# Patient Record
Sex: Female | Born: 1975
Health system: Southern US, Community
[De-identification: ages and names within clinical notes are randomized; demographics above are authoritative.]

## PROBLEM LIST (undated history)

## (undated) DIAGNOSIS — T781XXA Other adverse food reactions, not elsewhere classified, initial encounter: Secondary | ICD-10-CM

## (undated) DIAGNOSIS — E039 Hypothyroidism, unspecified: Secondary | ICD-10-CM

## (undated) HISTORY — DX: Hypothyroidism, unspecified: E03.9

## (undated) HISTORY — DX: Other adverse food reactions, not elsewhere classified, initial encounter: T78.1XXA

---

## 2018-03-06 DIAGNOSIS — Z23 Encounter for immunization: Secondary | ICD-10-CM | POA: Diagnosis not present

## 2019-05-27 DIAGNOSIS — Z20828 Contact with and (suspected) exposure to other viral communicable diseases: Secondary | ICD-10-CM | POA: Diagnosis not present

## 2019-05-27 DIAGNOSIS — Z20822 Contact with and (suspected) exposure to covid-19: Secondary | ICD-10-CM | POA: Diagnosis not present

## 2019-05-27 DIAGNOSIS — R519 Headache, unspecified: Secondary | ICD-10-CM | POA: Diagnosis not present

## 2019-05-27 DIAGNOSIS — R05 Cough: Secondary | ICD-10-CM | POA: Diagnosis not present

## 2019-05-27 DIAGNOSIS — Z7189 Other specified counseling: Secondary | ICD-10-CM | POA: Diagnosis not present

## 2019-11-04 ENCOUNTER — Other Ambulatory Visit: Payer: Self-pay | Admitting: Family Medicine

## 2019-11-04 ENCOUNTER — Other Ambulatory Visit (HOSPITAL_COMMUNITY)
Admission: RE | Admit: 2019-11-04 | Discharge: 2019-11-04 | Disposition: A | Discharge status: Home / Self Care | Payer: BC Managed Care – PPO | Source: Ambulatory Visit | Attending: Family Medicine | Admitting: Family Medicine

## 2019-11-04 DIAGNOSIS — Z124 Encounter for screening for malignant neoplasm of cervix: Secondary | ICD-10-CM | POA: Insufficient documentation

## 2019-11-04 DIAGNOSIS — Z0000 Encounter for general adult medical examination without abnormal findings: Secondary | ICD-10-CM | POA: Diagnosis not present

## 2019-11-05 ENCOUNTER — Other Ambulatory Visit: Payer: Self-pay | Admitting: Family Medicine

## 2019-11-05 DIAGNOSIS — Z1231 Encounter for screening mammogram for malignant neoplasm of breast: Secondary | ICD-10-CM

## 2019-11-06 LAB — CYTOLOGY - PAP
Comment: NEGATIVE
Diagnosis: NEGATIVE
High risk HPV: NEGATIVE

## 2019-11-13 DIAGNOSIS — Z13220 Encounter for screening for lipoid disorders: Secondary | ICD-10-CM | POA: Diagnosis not present

## 2019-11-13 DIAGNOSIS — Z0000 Encounter for general adult medical examination without abnormal findings: Secondary | ICD-10-CM | POA: Diagnosis not present

## 2019-11-19 ENCOUNTER — Ambulatory Visit
Admission: RE | Admit: 2019-11-19 | Discharge: 2019-11-19 | Disposition: A | Discharge status: Home / Self Care | Payer: BC Managed Care – PPO | Source: Ambulatory Visit | Attending: Family Medicine | Admitting: Family Medicine

## 2019-11-19 ENCOUNTER — Other Ambulatory Visit: Payer: Self-pay

## 2019-11-19 DIAGNOSIS — Z1231 Encounter for screening mammogram for malignant neoplasm of breast: Secondary | ICD-10-CM

## 2019-11-24 ENCOUNTER — Other Ambulatory Visit: Payer: Self-pay | Admitting: Family Medicine

## 2019-11-24 DIAGNOSIS — R928 Other abnormal and inconclusive findings on diagnostic imaging of breast: Secondary | ICD-10-CM

## 2019-11-26 DIAGNOSIS — D259 Leiomyoma of uterus, unspecified: Secondary | ICD-10-CM | POA: Diagnosis not present

## 2019-11-26 DIAGNOSIS — N8302 Follicular cyst of left ovary: Secondary | ICD-10-CM | POA: Diagnosis not present

## 2019-11-26 DIAGNOSIS — N839 Noninflammatory disorder of ovary, fallopian tube and broad ligament, unspecified: Secondary | ICD-10-CM | POA: Diagnosis not present

## 2019-11-30 NOTE — Progress Notes (Signed)
New Patient Note  RE: Sarah Leblanc MRN: [id number] DOB: 01/09/1976 Date of Office Visit: 12/01/2019  Referring provider: Ayesha RumpfYonjof, Mary, FNP Primary care provider: Ayesha RumpfYonjof, Mary, FNP  Chief Complaint: Food Intolerance (hazelnuts, raw almonds, walnuts, banana, apples, carrots, watermelon, broccoli, gum, raw leafy green veggies,carrageenan,), Allergic Rhinitis  (runny nose, watery eyes, congestion), and Allergic Reaction (pine trees cause hives and itching )  History of Present Illness: I had the pleasure of seeing Sarah Leblanc for initial evaluation at the [facility] on 12/01/2019. She is a 44 y.o. female, who is referred here by Ayesha RumpfYonjof, Mary, FNP for the evaluation of food sensitivities.  Foods:  She reports food allergy to multiple foods.   Broccoli (raw or cooked) causes stomach pain and vomiting usually within the same day. Last exposure was over 20 years ago.   Lettuces/leafy greens (raw) causes stomach pain for the past 20+ years. Tolerates cooked forms with no issues.  When there are high pollens counts these fresh foods cause perioral pruritus - bananas, apples, carrots, watermelon, cantaloupe, cherries. Tolerates processed forms and the apples she can tolerate during non-pollen seasons.   Gum - xantham gum, carrageenan cause stomach pains and joint pains.   Hazelnuts, raw almonds and walnuts cause perioral pruritus but tolerates if they are cooked.   Symptoms usually last for less than 1 day.  She was never evaluated in ED. She does have access to epinephrine autoinjector and not needed to use it.   Past work up includes: as a child but not sure of exact results.  Dietary History: patient has been eating other foods including milk, eggs, limited peanut, sesame, shellfish, seafood, soy, wheat, meats, fruits and vegetables.  Rhinitis: She reports symptoms of rhinorrhea, sneezing, nasal congestion, itchy eyes. Symptoms have been going on for 40+ years. The  symptoms are present mainly during the spring and fall. Anosmia: diminished. Headache: no. She has used zyrtec and Flonase prn with fair improvement in symptoms. Sinus infections: yes but not recently. Previous work up includes: as a child and had multiple positives per patient report. Patient was on allergy injections as a child for a few years with some benefit. No reactions to the injections.  Previous ENT evaluation: yes for burst eardrum. Previous sinus imaging: no. History of nasal polyps: no. Last eye exam: this year. History of reflux: takes famotidine 1-2 per day as needed.  Assessment and Plan: Sarah Leblanc is a 44 y.o. female with: Other allergic rhinitis Rhinoconjunctivitis symptoms for the past 40+ years mainly from the spring through fall.  Tried Zyrtec and Flonase with some benefit.  Used to be on allergy immunotherapy as a child with good benefit.  Today's skin testing showed: Positive to grass, weed, ragweed, trees, mold, dust mites, cat, cockroach.   Start environmental control measures as below.  May use over the counter antihistamines such as Zyrtec (cetirizine), Claritin (loratadine), Allegra (fexofenadine), or Xyzal (levocetirizine) daily as needed. May take it twice a day if needed.  May use Flonase (fluticasone) nasal spray 1 spray per nostril twice a day as needed for nasal congestion.   Declines eye drops.   Nasal saline spray (i.e., Simply Saline) or nasal saline lavage (i.e., NeilMed) is recommended as needed and prior to medicated nasal sprays.  Had a detailed discussion with patient/family that clinical history is suggestive of allergic rhinitis, and may benefit from allergy immunotherapy (AIT). Discussed in detail regarding the dosing, schedule, side effects (mild to moderate local allergic reaction and rarely  systemic allergic reactions including anaphylaxis), and benefits (significant improvement in nasal symptoms, seasonal flares of asthma) of immunotherapy with  the patient. There is significant time commitment involved with allergy shots, which includes weekly immunotherapy injections for first 9-12 months and then biweekly to monthly injections for 3-5 years. Consent signed.   Let us know when ready to start injections - patient will check with insurance first. Will need to send in Auvi-Q once decides to start.   Allergic conjunctivitis of both eyes  See assessment and plan as above for allergic rhinitis.  Adverse food reaction Patient avoiding a variety of foods including broccoli, lettuce, leafy greens, certain fresh fruits, gums, tree nuts and carrageenan. Symptoms include GI issues to perioral pruritus.   Today's skin testing showed: Borderline positive to walnuts, almond, hazelnut, pistachio, cabbage. Positive to peanuts.  Discussed that some of her food triggered oral and throat symptoms are likely caused by oral food allergy syndrome (OFAS). This is caused by cross reactivity of pollen with fresh fruits and vegetables, and nuts. Symptoms are usually localized in the form of itching and burning in mouth and throat. Very rarely it can progress to more severe symptoms. Eating foods in cooked or processed forms usually minimizes symptoms. I recommended avoidance of eating the problem foods, especially during the peak season(s). Sometimes, OFAS can induce severe throat swelling or even a systemic reaction; with such instance, I advised them to report to a local ER. A list of common pollens and food cross-reactivities was provided to the patient.   Continue to avoid foods that are bothersome.  For mild symptoms you can take over the counter antihistamines such as Benadryl and monitor symptoms closely. If symptoms worsen or if you have severe symptoms including breathing issues, throat closure, significant swelling, whole body hives, severe diarrhea and vomiting, lightheadedness then inject epinephrine and seek immediate medical care  afterwards.  Pollen-food allergy  See assessment and plan as above.  Insect sting Large localized reaction to fire ant bites.  Today's skin testing was negative to fire ant.   If having systemic symptoms in the future then will order fire ant IgE at that time.   Return in about 4 months (around 04/02/2020).  Other allergy screening: Asthma: as a child but no inhaler use for the past 30+ years Medication allergy:  ? Sulfa caused hives Hymenoptera allergy:   Fire ants cause large localized swelling  Urticaria: yes  Pines cause itchy bumps.  Eczema:no History of recurrent infections suggestive of immunodeficency: no  Diagnostics: Skin Testing: Environmental allergy panel and select foods. Positive to grass, weed, ragweed, trees, mold, dust mites, cat, cockroach.  Borderline positive to walnuts, almond, hazelnut, pistachio, cabbage. Positive to peanuts. Negative to fire ant.  Results discussed with patient/family.  Airborne Adult Perc - 12/01/19 1128    Time Antigen Placed 1125    Allergen Manufacturer Waynette Buttery    Location Back    Number of Test 59    Panel 1 Select    1. Control-Buffer 50% Glycerol Negative    2. Control-Histamine 1 mg/ml 2+    3. Albumin saline Negative    4. Bahia Negative    5. French [place] 4+    6. Johnson 4+    7. Kentucky Blue 4+    8. Meadow Fescue 4+    9. Perennial Rye 4+    10. Sweet Vernal 4+    11. Timothy --   +/-   12. Cocklebur 2+    13. Burweed Marshelder  4+    14. Ragweed, short 4+    15. Ragweed, Giant 2+    16. Plantain,  English 2+    17. Lamb's Quarters --   +/-   18. Sheep Sorrell 2+    19. Rough Pigweed 2+    20. Marsh Elder, Rough 3+    21. Mugwort, Common 3+    22. Ash mix 4+    23. Birch mix 4+    24. Beech American 4+    25. Box, Elder 4+    26. Cedar, red 4+    27. Cottonwood, Eastern 3+    28. Elm mix 3+    29. Hickory 4+    30. Maple mix Negative    31. Oak, Guinea[place] mix 4+    32. Pecan Pollen 4+    33. Pine  mix 4+    34. Sycamore Eastern 2+    35. Walnut, Black Pollen 4+    36. Alternaria alternata 2+    37. Cladosporium Herbarum Negative    38. Aspergillus mix 2+    39. Penicillium mix Negative    40. Bipolaris sorokiniana (Helminthosporium) 4+    41. Drechslera spicifera (Curvularia) 4+    42. Mucor plumbeus Negative    43. Fusarium moniliforme 4+    44. Aureobasidium pullulans (pullulara) Negative    45. Rhizopus oryzae Negative    46. Botrytis cinera 2+    47. Epicoccum nigrum 3+    48. Phoma betae Negative    49. Candida Albicans Negative    50. Trichophyton mentagrophytes Negative    51. Mite, D Farinae  5,000 AU/ml 3+    52. Mite, D Pteronyssinus  5,000 AU/ml 3+    53. Cat Hair 10,000 BAU/ml 2+    54.  Dog Epithelia Negative    55. Mixed Feathers Negative    56. Horse Epithelia Negative    57. Cockroach, German Negative    58. Mouse Negative    59. Tobacco Leaf Negative          Intradermal - 12/01/19 1202    Time Antigen Placed 1158    Allergen Manufacturer Waynette Buttery    Location Arm    Number of Test 3    Intradermal Select    Control Negative    Dog Negative    Cockroach 3+          Food Adult Perc - 12/01/19 1100    Time Antigen Placed 1125    Allergen Manufacturer Waynette Buttery    Location Back    Number of allergen test 18    1. Peanut --   9x5   10. Cashew Negative    11. Pecan Food Negative    12. Walnut Food --   3x3   13. Almond --   3x3   14. Hazelnut --   +\-   15. [place] nut Negative    16. Coconut Negative    17. Pistachio --   3x3   50. Cabbage Negative   +\-   51. Carrots Negative    57. Banana Negative    58. Apple Negative    61. Cantaloupe Negative    62. Watermelon Negative    65. Karaya Gum Negative    66. Acacia (Arabic Gum) Negative    6. Other Negative           Past Medical History: Patient Active Problem List   Diagnosis Date Noted  . Other allergic rhinitis 12/01/2019  . Pollen-food allergy 12/01/2019  .  Adverse food  reaction 12/01/2019  . Insect sting 12/01/2019  . Allergic conjunctivitis of both eyes 12/01/2019   Past Medical History:  Diagnosis Date  . Food sensitivity with gastrointestinal symptoms   . Hypothyroidism   . Oral allergy syndrome    Past Surgical History: History reviewed. No pertinent surgical history. Medication List:  Current Outpatient Medications  Medication Sig Dispense Refill  . atorvastatin (LIPITOR) 20 MG tablet Take 20 mg by mouth daily.    . calcium-vitamin D (OSCAL WITH D) 500-200 MG-UNIT tablet Take 1 tablet by mouth.    . cetirizine (ZYRTEC) 10 MG tablet Take 10 mg by mouth daily.    . famotidine (PEPCID) 10 MG tablet Take 10 mg by mouth 2 (two) times daily.    Marland Kitchen levothyroxine (SYNTHROID) 50 MCG tablet Take 50 mcg by mouth daily before breakfast.    . Multiple Vitamin (MULTIVITAMIN) capsule Take 1 capsule by mouth daily.     No current facility-administered medications for this visit.   Allergies: Allergies  Allergen Reactions  . Other Hives   Social History: Social History   Socioeconomic History  . Marital status: Married    Spouse name: Not on file  . Number of children: Not on file  . Years of education: Not on file  . Highest education level: Not on file  Occupational History  . Not on file  Tobacco Use  . Smoking status: Never Smoker  . Smokeless tobacco: Never Used  Vaping Use  . Vaping Use: Never used  Substance and Sexual Activity  . Alcohol use: Never  . Drug use: Never  . Sexual activity: Never  Other Topics Concern  . Not on file  Social History Narrative  . Not on file   Social Determinants of Health   Financial Resource Strain:   . Difficulty of Paying Living Expenses:   Food Insecurity:   . Worried About Programme researcher, broadcasting/film/video in the Last Year:   . Barista in the Last Year:   Transportation Needs:   . Freight forwarder (Medical):   Marland Kitchen Lack of Transportation (Non-Medical):   Physical Activity:   . Days of  Exercise per Week:   . Minutes of Exercise per Session:   Stress:   . Feeling of Stress :   Social Connections:   . Frequency of Communication with Friends and Family:   . Frequency of Social Gatherings with Friends and Family:   . Attends Religious Services:   . Active Member of Clubs or Organizations:   . Attends Banker Meetings:   Marland Kitchen Marital Status:    Lives in a 44 year old house. Smoking: denies Occupation: Engineer, civil (consulting) HistorySurveyor, minerals in the house: no Engineer, civil (consulting) in the family room: yes Carpet in the bedroom: yes Heating: gas Cooling: central Pet: no  Family History: Family History  Problem Relation Age of Onset  . Hypothyroidism Mother   . Arthritis/Rheumatoid Father    Problem                               Relation Asthma                                   No  Eczema  No  Food allergy                          No  Allergic rhino conjunctivitis     No   Review of Systems  Constitutional: Negative for appetite change, chills, fever and unexpected weight change.  HENT: Positive for congestion and rhinorrhea.   Eyes: Positive for itching.  Respiratory: Negative for cough, chest tightness, shortness of breath and wheezing.   Cardiovascular: Negative for chest pain.  Gastrointestinal: Negative for abdominal pain.  Genitourinary: Negative for difficulty urinating.  Skin: Negative for rash.  Allergic/Immunologic: Positive for environmental allergies and food allergies.  Neurological: Negative for headaches.   Objective: BP 116/78 (BP Location: Left Arm, Patient Position: Sitting, Cuff Size: Normal)   Pulse 81   Temp 97.6 F (36.4 C) (Temporal)   Resp 16   Ht 5\' 2"  (1.575 m)   Wt 126 lb 12.8 oz (57.5 kg)   SpO2 97%   BMI 23.19 kg/m  Body mass index is 23.19 kg/m. Physical Exam Vitals and nursing note reviewed.  Constitutional:      Appearance: Normal appearance. She is well-developed.   HENT:     Head: Normocephalic and atraumatic.     Right Ear: Tympanic membrane and external ear normal.     Left Ear: Tympanic membrane and external ear normal.     Nose: Nose normal. No rhinorrhea.     Mouth/Throat:     Mouth: Mucous membranes are moist.     Pharynx: Oropharynx is clear.  Eyes:     Conjunctiva/sclera: Conjunctivae normal.  Cardiovascular:     Rate and Rhythm: Normal rate and regular rhythm.     Heart sounds: Normal heart sounds. No murmur heard.  No friction rub. No gallop.   Pulmonary:     Effort: Pulmonary effort is normal.     Breath sounds: Normal breath sounds. No wheezing, rhonchi or rales.  Musculoskeletal:     Cervical back: Neck supple.  Skin:    General: Skin is warm.     Findings: No rash.  Neurological:     Mental Status: She is alert and oriented to person, place, and time.  Psychiatric:        Behavior: Behavior normal.    The plan was reviewed with the patient/family, and all questions/concerned were addressed.  It was my pleasure to see Sarah Leblanc today and participate in her care. Please feel free to contact me with any questions or concerns.  Sincerely,  Sarah Sago, DO Allergy & Immunology  [facility] office: [phone] [facility] office: [phone] [place] office: [phone]

## 2019-12-01 ENCOUNTER — Encounter: Payer: Self-pay | Admitting: Allergy

## 2019-12-01 ENCOUNTER — Ambulatory Visit (INDEPENDENT_AMBULATORY_CARE_PROVIDER_SITE_OTHER): Payer: BC Managed Care – PPO | Admitting: Allergy

## 2019-12-01 ENCOUNTER — Other Ambulatory Visit: Payer: Self-pay

## 2019-12-01 VITALS — BP 116/78 | HR 81 | Temp 97.6°F | Resp 16 | Ht 62.0 in | Wt 126.8 lb

## 2019-12-01 DIAGNOSIS — T63481D Toxic effect of venom of other arthropod, accidental (unintentional), subsequent encounter: Secondary | ICD-10-CM

## 2019-12-01 DIAGNOSIS — T781XXD Other adverse food reactions, not elsewhere classified, subsequent encounter: Secondary | ICD-10-CM | POA: Insufficient documentation

## 2019-12-01 DIAGNOSIS — J3089 Other allergic rhinitis: Secondary | ICD-10-CM | POA: Diagnosis not present

## 2019-12-01 DIAGNOSIS — H1013 Acute atopic conjunctivitis, bilateral: Secondary | ICD-10-CM | POA: Insufficient documentation

## 2019-12-01 DIAGNOSIS — T781XXA Other adverse food reactions, not elsewhere classified, initial encounter: Secondary | ICD-10-CM | POA: Insufficient documentation

## 2019-12-01 DIAGNOSIS — T63481A Toxic effect of venom of other arthropod, accidental (unintentional), initial encounter: Secondary | ICD-10-CM | POA: Insufficient documentation

## 2019-12-01 NOTE — Assessment & Plan Note (Signed)
Rhinoconjunctivitis symptoms for the past 40+ years mainly from the spring through fall.  Tried Zyrtec and Flonase with some benefit.  Used to be on allergy immunotherapy as a child with good benefit.  Today's skin testing showed: Positive to grass, weed, ragweed, trees, mold, dust mites, cat, cockroach.   Start environmental control measures as below.  May use over the counter antihistamines such as Zyrtec (cetirizine), Claritin (loratadine), Allegra (fexofenadine), or Xyzal (levocetirizine) daily as needed. May take it twice a day if needed.  May use Flonase (fluticasone) nasal spray 1 spray per nostril twice a day as needed for nasal congestion.   Declines eye drops.   Nasal saline spray (i.e., Simply Saline) or nasal saline lavage (i.e., NeilMed) is recommended as needed and prior to medicated nasal sprays.  Had a detailed discussion with patient/family that clinical history is suggestive of allergic rhinitis, and may benefit from allergy immunotherapy (AIT). Discussed in detail regarding the dosing, schedule, side effects (mild to moderate local allergic reaction and rarely systemic allergic reactions including anaphylaxis), and benefits (significant improvement in nasal symptoms, seasonal flares of asthma) of immunotherapy with the patient. There is significant time commitment involved with allergy shots, which includes weekly immunotherapy injections for first 9-12 months and then biweekly to monthly injections for 3-5 years. Consent signed.   Let us know when ready to start injections - patient will check with insurance first. Will need to send in Auvi-Q once decides to start.

## 2019-12-01 NOTE — Assessment & Plan Note (Signed)
.   See assessment and plan as above. 

## 2019-12-01 NOTE — Patient Instructions (Addendum)
Today's skin testing showed: Positive to grass, weed, ragweed, trees, mold, dust mites, cat, cockroach.  Borderline positive to walnuts, almond, hazelnut, pistachio, cabbage. Positive to peanuts. Negative to fire ant.  Food:   Discussed that her food triggered oral and throat symptoms are likely caused by oral food allergy syndrome (OFAS). This is caused by cross reactivity of pollen with fresh fruits and vegetables, and nuts. Symptoms are usually localized in the form of itching and burning in mouth and throat. Very rarely it can progress to more severe symptoms. Eating foods in cooked or processed forms usually minimizes symptoms. I recommended avoidance of eating the problem foods, especially during the peak season(s). Sometimes, OFAS can induce severe throat swelling or even a systemic reaction; with such instance, I advised them to report to a local ER. A list of common pollens and food cross-reactivities was provided to the patient.   Continue to avoid foods that are bothersome.  Environmental allergies  Start environmental control measures as below.  May use over the counter antihistamines such as Zyrtec (cetirizine), Claritin (loratadine), Allegra (fexofenadine), or Xyzal (levocetirizine) daily as needed. May take it twice a day if needed.  May use Flonase (fluticasone) nasal spray 1 spray per nostril twice a day as needed for nasal congestion.   Nasal saline spray (i.e., Simply Saline) or nasal saline lavage (i.e., NeilMed) is recommended as needed and prior to medicated nasal sprays.  Had a detailed discussion with patient/family that clinical history is suggestive of allergic rhinitis, and may benefit from allergy immunotherapy (AIT). Discussed in detail regarding the dosing, schedule, side effects (mild to moderate local allergic reaction and rarely systemic allergic reactions including anaphylaxis), and benefits (significant improvement in nasal symptoms, seasonal flares of asthma)  of immunotherapy with the patient. There is significant time commitment involved with allergy shots, which includes weekly immunotherapy injections for first 9-12 months and then biweekly to monthly injections for 3-5 years.   Let us know when ready to start injections.  Insect stings:  If you notice any worsening symptoms. Let us know and we can order the bloodwork.  Follow up in 4 months or sooner if needed.  3 weeks for first allergy injection.   Reducing Pollen Exposure . Pollen seasons: trees (spring), grass (summer) and ragweed/weeds (fall). Marland Kitchen Keep windows closed in your home and car to lower pollen exposure.  Lilian Kapur air conditioning in the bedroom and throughout the house if possible.  . Avoid going out in dry windy days - especially early morning. . Pollen counts are highest between 5 - 10 AM and on dry, hot and windy days.  . Save outside activities for late afternoon or after a heavy rain, when pollen levels are lower.  . Avoid mowing of grass if you have grass pollen allergy. Marland Kitchen Be aware that pollen can also be transported indoors on people and pets.  . Dry your clothes in an automatic dryer rather than hanging them outside where they might collect pollen.  . Rinse hair and eyes before bedtime. Mold Control . Mold and fungi can grow on a variety of surfaces provided certain temperature and moisture conditions exist.  . Outdoor molds grow on plants, decaying vegetation and soil. The major outdoor mold, Alternaria and Cladosporium, are found in very high numbers during hot and dry conditions. Generally, a late summer - fall peak is seen for common outdoor fungal spores. Rain will temporarily lower outdoor mold spore count, but counts rise rapidly when the rainy period ends. Marland Kitchen  The most important indoor molds are Aspergillus and Penicillium. Dark, humid and poorly ventilated basements are ideal sites for mold growth. The next most common sites of mold growth are the bathroom and the  kitchen. Outdoor (Seasonal) Mold Control . Use air conditioning and keep windows closed. . Avoid exposure to decaying vegetation. Marland Kitchen Avoid leaf raking. . Avoid grain handling. . Consider wearing a face mask if working in moldy areas.  Indoor (Perennial) Mold Control  . Maintain humidity below 50%. . Get rid of mold growth on hard surfaces with water, detergent and, if necessary, 5% bleach (do not mix with other cleaners). Then dry the area completely. If mold covers an area more than 10 square feet, consider hiring an indoor environmental professional. . For clothing, washing with soap and water is best. If moldy items cannot be cleaned and dried, throw them away. . Remove sources e.g. contaminated carpets. . Repair and seal leaking roofs or pipes. Using dehumidifiers in damp basements may be helpful, but empty the water and clean units regularly to prevent mildew from forming. All rooms, especially basements, bathrooms and kitchens, require ventilation and cleaning to deter mold and mildew growth. Avoid carpeting on concrete or damp floors, and storing items in damp areas. Control of House Dust Mite Allergen . Dust mite allergens are a common trigger of allergy and asthma symptoms. While they can be found throughout the house, these microscopic creatures thrive in warm, humid environments such as bedding, upholstered furniture and carpeting. . Because so much time is spent in the bedroom, it is essential to reduce mite levels there.  . Encase pillows, mattresses, and box springs in special allergen-proof fabric covers or airtight, zippered plastic covers.  . Bedding should be washed weekly in hot water (130 F) and dried in a hot dryer. Allergen-proof covers are available for comforters and pillows that can't be regularly washed.  Reyes Ivan the allergy-proof covers every few months. Minimize clutter in the bedroom. Keep pets out of the bedroom.  Marland Kitchen Keep humidity less than 50% by using a dehumidifier  or air conditioning. You can buy a humidity measuring device called a hygrometer to monitor this.  . If possible, replace carpets with hardwood, linoleum, or washable area rugs. If that's not possible, vacuum frequently with a vacuum that has a HEPA filter. . Remove all upholstered furniture and non-washable window drapes from the bedroom. . Remove all non-washable stuffed toys from the bedroom.  Wash stuffed toys weekly. Pet Allergen Avoidance: . Contrary to popular opinion, there are no "hypoallergenic" breeds of dogs or cats. That is because people are not allergic to an animal's hair, but to an allergen found in the animal's saliva, dander (dead skin flakes) or urine. Pet allergy symptoms typically occur within minutes. For some people, symptoms can build up and become most severe 8 to 12 hours after contact with the animal. People with severe allergies can experience reactions in public places if dander has been transported on the pet owners' clothing. Marland Kitchen Keeping an animal outdoors is only a partial solution, since homes with pets in the yard still have higher concentrations of animal allergens. . Before getting a pet, ask your allergist to determine if you are allergic to animals. If your pet is already considered part of your family, try to minimize contact and keep the pet out of the bedroom and other rooms where you spend a great deal of time. . As with dust mites, vacuum carpets often or replace carpet with a  hardwood floor, tile or linoleum. . High-efficiency particulate air (HEPA) cleaners can reduce allergen levels over time. . While dander and saliva are the source of cat and dog allergens, urine is the source of allergens from rabbits, hamsters, mice and [place] pigs; so ask a non-allergic family member to clean the animal's cage. . If you have a pet allergy, talk to your allergist about the potential for allergy immunotherapy (allergy shots). This strategy can often provide long-term  relief.

## 2019-12-01 NOTE — Assessment & Plan Note (Signed)
Patient avoiding a variety of foods including broccoli, lettuce, leafy greens, certain fresh fruits, gums, tree nuts and carrageenan. Symptoms include GI issues to perioral pruritus.   Today's skin testing showed: Borderline positive to walnuts, almond, hazelnut, pistachio, cabbage. Positive to peanuts.  Discussed that some of her food triggered oral and throat symptoms are likely caused by oral food allergy syndrome (OFAS). This is caused by cross reactivity of pollen with fresh fruits and vegetables, and nuts. Symptoms are usually localized in the form of itching and burning in mouth and throat. Very rarely it can progress to more severe symptoms. Eating foods in cooked or processed forms usually minimizes symptoms. I recommended avoidance of eating the problem foods, especially during the peak season(s). Sometimes, OFAS can induce severe throat swelling or even a systemic reaction; with such instance, I advised them to report to a local ER. A list of common pollens and food cross-reactivities was provided to the patient.   Continue to avoid foods that are bothersome.  For mild symptoms you can take over the counter antihistamines such as Benadryl and monitor symptoms closely. If symptoms worsen or if you have severe symptoms including breathing issues, throat closure, significant swelling, whole body hives, severe diarrhea and vomiting, lightheadedness then inject epinephrine and seek immediate medical care afterwards.

## 2019-12-01 NOTE — Assessment & Plan Note (Signed)
   See assessment and plan as above for allergic rhinitis.  

## 2019-12-01 NOTE — Assessment & Plan Note (Signed)
Large localized reaction to fire ant bites.  Today's skin testing was negative to fire ant.   If having systemic symptoms in the future then will order fire ant IgE at that time.

## 2019-12-02 ENCOUNTER — Ambulatory Visit
Admission: RE | Admit: 2019-12-02 | Discharge: 2019-12-02 | Disposition: A | Discharge status: Home / Self Care | Payer: BC Managed Care – PPO | Source: Ambulatory Visit | Attending: Family Medicine | Admitting: Family Medicine

## 2019-12-02 ENCOUNTER — Telehealth: Payer: Self-pay | Admitting: Allergy

## 2019-12-02 DIAGNOSIS — R928 Other abnormal and inconclusive findings on diagnostic imaging of breast: Secondary | ICD-10-CM | POA: Diagnosis not present

## 2019-12-02 DIAGNOSIS — N6002 Solitary cyst of left breast: Secondary | ICD-10-CM | POA: Diagnosis not present

## 2019-12-02 MED ORDER — EPINEPHRINE 0.3 MG/0.3ML IJ SOAJ
0.3000 mg | Freq: Once | INTRAMUSCULAR | 2 refills | Status: AC
Start: 2019-12-02 — End: 2019-12-02

## 2019-12-02 NOTE — Telephone Encounter (Signed)
Patient seen Dr. Selena Batten yesterday and Auvi-Q was to be sent in if patient decided to move forward with allergy shots. I have sent the prescription to [facility] and I have left a detailed message informing patient.

## 2019-12-02 NOTE — Telephone Encounter (Signed)
Patient called to set up starting allergy injections. Patient would like Epi Pen sent into pharmacy.  Please advise.

## 2019-12-10 NOTE — Addendum Note (Signed)
Addended by: Ellamae Sia on: 12/10/2019 05:45 PM   Modules accepted: Orders

## 2019-12-17 DIAGNOSIS — J301 Allergic rhinitis due to pollen: Secondary | ICD-10-CM | POA: Diagnosis not present

## 2019-12-18 DIAGNOSIS — J3089 Other allergic rhinitis: Secondary | ICD-10-CM | POA: Diagnosis not present

## 2020-01-14 ENCOUNTER — Other Ambulatory Visit: Payer: Self-pay

## 2020-01-14 ENCOUNTER — Ambulatory Visit (INDEPENDENT_AMBULATORY_CARE_PROVIDER_SITE_OTHER): Payer: BC Managed Care – PPO

## 2020-01-14 DIAGNOSIS — J309 Allergic rhinitis, unspecified: Secondary | ICD-10-CM | POA: Diagnosis not present

## 2020-01-14 NOTE — Progress Notes (Signed)
Immunotherapy   Patient Details  Name: Sarah Leblanc MRN: [id number] Date of Birth: May 13, 1976  01/14/2020  Tereasa Coop here to start allergy injections. Patient received 0.05 of bother her blue vials with an expiration of 12/17/2020. One with G-W-RW-T and the other with M-DM-C-CR. Patient  Following schedule:  B Frequency: Weekly  Epi-Pen: Yes Consent signed and patient instructions given.   Dub Mikes 01/14/2020, 10:15 AM

## 2020-01-20 DIAGNOSIS — E039 Hypothyroidism, unspecified: Secondary | ICD-10-CM | POA: Diagnosis not present

## 2020-01-21 ENCOUNTER — Ambulatory Visit (INDEPENDENT_AMBULATORY_CARE_PROVIDER_SITE_OTHER): Payer: BC Managed Care – PPO

## 2020-01-21 ENCOUNTER — Other Ambulatory Visit: Payer: Self-pay

## 2020-01-21 DIAGNOSIS — J309 Allergic rhinitis, unspecified: Secondary | ICD-10-CM | POA: Diagnosis not present

## 2020-01-26 ENCOUNTER — Other Ambulatory Visit: Payer: Self-pay

## 2020-01-26 ENCOUNTER — Ambulatory Visit (INDEPENDENT_AMBULATORY_CARE_PROVIDER_SITE_OTHER): Payer: BC Managed Care – PPO

## 2020-01-26 DIAGNOSIS — J309 Allergic rhinitis, unspecified: Secondary | ICD-10-CM | POA: Diagnosis not present

## 2020-02-02 ENCOUNTER — Other Ambulatory Visit: Payer: Self-pay

## 2020-02-02 ENCOUNTER — Ambulatory Visit (INDEPENDENT_AMBULATORY_CARE_PROVIDER_SITE_OTHER): Payer: BC Managed Care – PPO

## 2020-02-02 DIAGNOSIS — J309 Allergic rhinitis, unspecified: Secondary | ICD-10-CM

## 2020-02-09 ENCOUNTER — Other Ambulatory Visit: Payer: Self-pay

## 2020-02-09 ENCOUNTER — Ambulatory Visit (INDEPENDENT_AMBULATORY_CARE_PROVIDER_SITE_OTHER): Payer: BC Managed Care – PPO

## 2020-02-09 DIAGNOSIS — J309 Allergic rhinitis, unspecified: Secondary | ICD-10-CM | POA: Diagnosis not present

## 2020-02-16 ENCOUNTER — Other Ambulatory Visit: Payer: Self-pay

## 2020-02-16 ENCOUNTER — Ambulatory Visit (INDEPENDENT_AMBULATORY_CARE_PROVIDER_SITE_OTHER): Payer: BC Managed Care – PPO

## 2020-02-16 DIAGNOSIS — J309 Allergic rhinitis, unspecified: Secondary | ICD-10-CM

## 2020-02-25 ENCOUNTER — Ambulatory Visit (INDEPENDENT_AMBULATORY_CARE_PROVIDER_SITE_OTHER): Payer: BC Managed Care – PPO

## 2020-02-25 ENCOUNTER — Other Ambulatory Visit: Payer: Self-pay

## 2020-02-25 DIAGNOSIS — J309 Allergic rhinitis, unspecified: Secondary | ICD-10-CM

## 2020-03-01 ENCOUNTER — Other Ambulatory Visit: Payer: Self-pay

## 2020-03-01 ENCOUNTER — Ambulatory Visit (INDEPENDENT_AMBULATORY_CARE_PROVIDER_SITE_OTHER): Payer: BC Managed Care – PPO

## 2020-03-01 DIAGNOSIS — J309 Allergic rhinitis, unspecified: Secondary | ICD-10-CM

## 2020-03-08 ENCOUNTER — Ambulatory Visit (INDEPENDENT_AMBULATORY_CARE_PROVIDER_SITE_OTHER): Payer: BC Managed Care – PPO

## 2020-03-08 ENCOUNTER — Other Ambulatory Visit: Payer: Self-pay

## 2020-03-08 DIAGNOSIS — J309 Allergic rhinitis, unspecified: Secondary | ICD-10-CM

## 2020-03-15 ENCOUNTER — Other Ambulatory Visit: Payer: Self-pay

## 2020-03-15 ENCOUNTER — Ambulatory Visit (INDEPENDENT_AMBULATORY_CARE_PROVIDER_SITE_OTHER): Payer: BC Managed Care – PPO

## 2020-03-15 DIAGNOSIS — J309 Allergic rhinitis, unspecified: Secondary | ICD-10-CM | POA: Diagnosis not present

## 2020-03-22 ENCOUNTER — Ambulatory Visit (INDEPENDENT_AMBULATORY_CARE_PROVIDER_SITE_OTHER): Payer: BC Managed Care – PPO

## 2020-03-22 ENCOUNTER — Other Ambulatory Visit: Payer: Self-pay

## 2020-03-22 DIAGNOSIS — J309 Allergic rhinitis, unspecified: Secondary | ICD-10-CM

## 2020-03-28 DIAGNOSIS — E039 Hypothyroidism, unspecified: Secondary | ICD-10-CM | POA: Diagnosis not present

## 2020-03-29 ENCOUNTER — Ambulatory Visit (INDEPENDENT_AMBULATORY_CARE_PROVIDER_SITE_OTHER): Payer: BC Managed Care – PPO

## 2020-03-29 ENCOUNTER — Other Ambulatory Visit: Payer: Self-pay

## 2020-03-29 DIAGNOSIS — J309 Allergic rhinitis, unspecified: Secondary | ICD-10-CM

## 2020-04-05 ENCOUNTER — Ambulatory Visit (INDEPENDENT_AMBULATORY_CARE_PROVIDER_SITE_OTHER): Payer: BC Managed Care – PPO | Admitting: Allergy

## 2020-04-05 ENCOUNTER — Other Ambulatory Visit: Payer: Self-pay

## 2020-04-05 ENCOUNTER — Encounter: Payer: Self-pay | Admitting: Allergy

## 2020-04-05 VITALS — BP 110/70 | HR 86 | Resp 18

## 2020-04-05 DIAGNOSIS — T63481D Toxic effect of venom of other arthropod, accidental (unintentional), subsequent encounter: Secondary | ICD-10-CM | POA: Diagnosis not present

## 2020-04-05 DIAGNOSIS — J302 Other seasonal allergic rhinitis: Secondary | ICD-10-CM

## 2020-04-05 DIAGNOSIS — T781XXD Other adverse food reactions, not elsewhere classified, subsequent encounter: Secondary | ICD-10-CM | POA: Diagnosis not present

## 2020-04-05 DIAGNOSIS — J3089 Other allergic rhinitis: Secondary | ICD-10-CM

## 2020-04-05 DIAGNOSIS — H1010 Acute atopic conjunctivitis, unspecified eye: Secondary | ICD-10-CM | POA: Insufficient documentation

## 2020-04-05 DIAGNOSIS — J309 Allergic rhinitis, unspecified: Secondary | ICD-10-CM | POA: Diagnosis not present

## 2020-04-05 MED ORDER — MONTELUKAST SODIUM 10 MG PO TABS: 10.0000 mg | ORAL_TABLET | Freq: Every day | ORAL | 5 refills | Status: DC

## 2020-04-05 NOTE — Assessment & Plan Note (Addendum)
Past history - Rhinoconjunctivitis symptoms for the past 40+ years mainly from the spring through fall. Used to be on allergy immunotherapy as a child with good benefit. 2021 skin testing showed: Positive to grass, weed, ragweed, trees, mold, dust mites, cat, cockroach.  Interim history - started AIT on 01/14/2020 (G-W-RW-T & M-DM-C-CR). Doing well with some localized reactions.   Continue allergy injections - given today.  Take Singulair 10mg  the night before injection. Cautioned that in some children/adults can experience behavioral changes including hyperactivity, agitation, depression, sleep disturbances and suicidal ideations. These side effects are rare, but if you notice them you should notify me and discontinue Singulair (montelukast).  May also take Xyzal the night before injection.  Continue environmental control measures as below.  May use over the counter antihistamines such as Zyrtec (cetirizine), Claritin (loratadine), Allegra (fexofenadine), or Xyzal (levocetirizine) daily as needed. May take it twice a day if needed.  May use Flonase (fluticasone) nasal spray 1 spray per nostril twice a day as needed for nasal congestion.   Nasal saline spray (i.e., Simply Saline) or nasal saline lavage (i.e., NeilMed) is recommended as needed and prior to medicated nasal sprays.

## 2020-04-05 NOTE — Progress Notes (Signed)
Follow Up Note  RE: Sarah Leblanc MRN: [id number] DOB: 02-19-1976 Date of Office Visit: 04/05/2020  Referring provider: Ayesha Rumpf, FNP Primary care provider: Ayesha Rumpf, FNP  Chief Complaint: follow up  History of Present Illness: I had the pleasure of seeing Sarah Leblanc for a follow up visit at the [facility] on 04/05/2020. She is a 44 y.o. female, who is being followed for allergic rhinoconjunctivitis on AIT, adverse food reaction, oral allergy syndrome. Her previous allergy office visit was on 12/01/2019 with Dr. Selena Batten. Today is a regular follow up visit.  Allergic rhino conjunctivitis Started allergy injections in September and having some localized reactions 2-3 hours after the injection. Currently Claritin twice a day the night before the injection.  Not using any eye drops or nasal sprays daily.  Adverse food reaction Currently avoiding foods that are bothering her.  Insect sting No stings since the last visit.   Assessment and Plan: Ashanti is a 44 y.o. female with: Seasonal and perennial allergic rhinoconjunctivitis Past history - Rhinoconjunctivitis symptoms for the past 40+ years mainly from the spring through fall. Used to be on allergy immunotherapy as a child with good benefit. 2021 skin testing showed: Positive to grass, weed, ragweed, trees, mold, dust mites, cat, cockroach.  Interim history - started AIT on 01/14/2020 (G-W-RW-T & M-DM-C-CR). Doing well with some localized reactions.   Continue allergy injections - given today.  Take Singulair 10mg  the night before injection. Cautioned that in some children/adults can experience behavioral changes including hyperactivity, agitation, depression, sleep disturbances and suicidal ideations. These side effects are rare, but if you notice them you should notify me and discontinue Singulair (montelukast).  May also take Xyzal the night before injection.  Continue environmental control measures as  below.  May use over the counter antihistamines such as Zyrtec (cetirizine), Claritin (loratadine), Allegra (fexofenadine), or Xyzal (levocetirizine) daily as needed. May take it twice a day if needed.  May use Flonase (fluticasone) nasal spray 1 spray per nostril twice a day as needed for nasal congestion.   Nasal saline spray (i.e., Simply Saline) or nasal saline lavage (i.e., NeilMed) is recommended as needed and prior to medicated nasal sprays.  Pollen-food allergy Past history - Patient avoiding a variety of foods including broccoli, lettuce, leafy greens, certain fresh fruits, gums, tree nuts and carrageenan. Symptoms include GI issues to perioral pruritus. 2021 skin testing showed: Borderline positive to walnuts, almond, hazelnut, pistachio, cabbage. Positive to peanuts. Interim history - no reactions.  Continue to avoid foods that are bothersome.  For mild symptoms you can take over the counter antihistamines such as Benadryl and monitor symptoms closely. If symptoms worsen or if you have severe symptoms including breathing issues, throat closure, significant swelling, whole body hives, severe diarrhea and vomiting, lightheadedness then inject epinephrine and seek immediate medical care afterwards.  Insect sting Past history - Large localized reaction to fire ant bites. 2021 skin testing was negative to fire ant.   If having systemic symptoms in the future then will order fire ant IgE at that time.   Return in about 6 months (around 10/03/2020).  Meds ordered this encounter  Medications  . montelukast (SINGULAIR) 10 MG tablet    Sig: Take 1 tablet (10 mg total) by mouth at bedtime.    Dispense:  30 tablet    Refill:  5   Lab Orders  No laboratory test(s) ordered today    Diagnostics: None.   Medication List:  Current Outpatient  Medications  Medication Sig Dispense Refill  . atorvastatin (LIPITOR) 20 MG tablet Take 20 mg by mouth daily.     Marland Kitchen AUVI-Q 0.3 MG/0.3ML SOAJ  injection Inject into the muscle as directed.     . calcium-vitamin D (OSCAL WITH D) 500-200 MG-UNIT tablet Take 1 tablet by mouth.     . cetirizine (ZYRTEC) 10 MG tablet Take 10 mg by mouth daily.     . famotidine (PEPCID) 10 MG tablet Take 10 mg by mouth 2 (two) times daily.     Marland Kitchen levothyroxine (SYNTHROID) 50 MCG tablet Take 50 mcg by mouth daily before breakfast.     . levothyroxine (SYNTHROID) 75 MCG tablet Take 75 mcg by mouth daily.     . Multiple Vitamin (MULTIVITAMIN) capsule Take 1 capsule by mouth daily.     . montelukast (SINGULAIR) 10 MG tablet Take 1 tablet (10 mg total) by mouth at bedtime. 30 tablet 5   No current facility-administered medications for this visit.   Allergies: Allergies  Allergen Reactions  . Other Hives   I reviewed her past medical history, social history, family history, and environmental history and no significant changes have been reported from her previous visit.  Review of Systems  Constitutional: Negative for appetite change, chills, fever and unexpected weight change.  HENT: Positive for congestion and rhinorrhea.   Eyes: Positive for itching.  Respiratory: Negative for cough, chest tightness, shortness of breath and wheezing.   Cardiovascular: Negative for chest pain.  Gastrointestinal: Negative for abdominal pain.  Genitourinary: Negative for difficulty urinating.  Skin: Negative for rash.  Allergic/Immunologic: Positive for environmental allergies and food allergies.  Neurological: Negative for headaches.   Objective: BP 110/70   Pulse 86   Resp 18   SpO2 98%  There is no height or weight on file to calculate BMI. Physical Exam Vitals and nursing note reviewed.  Constitutional:      Appearance: Normal appearance. She is well-developed.  HENT:     Head: Normocephalic and atraumatic.     Right Ear: Tympanic membrane and external ear normal.     Left Ear: Tympanic membrane and external ear normal.     Nose: Nose normal. No  rhinorrhea.     Mouth/Throat:     Mouth: Mucous membranes are moist.     Pharynx: Oropharynx is clear.  Eyes:     Conjunctiva/sclera: Conjunctivae normal.  Cardiovascular:     Rate and Rhythm: Normal rate and regular rhythm.     Heart sounds: Normal heart sounds. No murmur heard.  No friction rub. No gallop.   Pulmonary:     Effort: Pulmonary effort is normal.     Breath sounds: Normal breath sounds. No wheezing, rhonchi or rales.  Musculoskeletal:     Cervical back: Neck supple.  Skin:    General: Skin is warm.     Findings: No rash.  Neurological:     Mental Status: She is alert and oriented to person, place, and time.  Psychiatric:        Behavior: Behavior normal.    Previous notes and tests were reviewed. The plan was reviewed with the patient/family, and all questions/concerned were addressed.  It was my pleasure to see Sarah Leblanc today and participate in her care. Please feel free to contact me with any questions or concerns.  Sincerely,  Wyline Mood, DO Allergy & Immunology  [facility] office: [phone] [facility] office: [phone]

## 2020-04-05 NOTE — Patient Instructions (Addendum)
Environmental allergies Past skin testing showed:   Positive to grass, weed, ragweed, trees, mold, dust mites, cat, cockroach.   Continue allergy injections.  Take Singulair 10mg  the night before injection. Cautioned that in some children/adults can experience behavioral changes including hyperactivity, agitation, depression, sleep disturbances and suicidal ideations. These side effects are rare, but if you notice them you should notify me and discontinue Singulair (montelukast).  May also take Xyzal the night before injection.   Continue environmental control measures as below.  May use over the counter antihistamines such as Zyrtec (cetirizine), Claritin (loratadine), Allegra (fexofenadine), or Xyzal (levocetirizine) daily as needed. May take it twice a day if needed.  May use Flonase (fluticasone) nasal spray 1 spray per nostril twice a day as needed for nasal congestion.   Nasal saline spray (i.e., Simply Saline) or nasal saline lavage (i.e., NeilMed) is recommended as needed and prior to medicated nasal sprays.  Food:   Borderline positive to walnuts, almond, hazelnut, pistachio, cabbage.  Positive to peanuts.  Continue to avoid foods that are bothersome.  For mild symptoms you can take over the counter antihistamines such as Benadryl and monitor symptoms closely. If symptoms worsen or if you have severe symptoms including breathing issues, throat closure, significant swelling, whole body hives, severe diarrhea and vomiting, lightheadedness then inject epinephrine and seek immediate medical care afterwards.  Insect stings:  Negative skin testing to fire ant.  If you notice any worsening symptoms. Let know and we can order the bloodwork.  Follow up in 6 months or sooner if needed.   Reducing Pollen Exposure . Pollen seasons: trees (spring), grass (summer) and ragweed/weeds (fall). 09-20-1980 Keep windows closed in your home and car to lower pollen exposure.  Marland Kitchen air  conditioning in the bedroom and throughout the house if possible.  . Avoid going out in dry windy days - especially early morning. . Pollen counts are highest between 5 - 10 AM and on dry, hot and windy days.  . Save outside activities for late afternoon or after a heavy rain, when pollen levels are lower.  . Avoid mowing of grass if you have grass pollen allergy. Sarah Leblanc Be aware that pollen can also be transported indoors on people and pets.  . Dry your clothes in an automatic dryer rather than hanging them outside where they might collect pollen.  . Rinse hair and eyes before bedtime. Mold Control . Mold and fungi can grow on a variety of surfaces provided certain temperature and moisture conditions exist.  . Outdoor molds grow on plants, decaying vegetation and soil. The major outdoor mold, Alternaria and Cladosporium, are found in very high numbers during hot and dry conditions. Generally, a late summer - fall peak is seen for common outdoor fungal spores. Rain will temporarily lower outdoor mold spore count, but counts rise rapidly when the rainy period ends. . The most important indoor molds are Aspergillus and Penicillium. Dark, humid and poorly ventilated basements are ideal sites for mold growth. The next most common sites of mold growth are the bathroom and the kitchen. Outdoor (Seasonal) Mold Control . Use air conditioning and keep windows closed. . Avoid exposure to decaying vegetation. 03-[birth year]-1976 Avoid leaf raking. . Avoid grain handling. . Consider wearing a face mask if working in moldy areas.  Indoor (Perennial) Mold Control  . Maintain humidity below 50%. . Get rid of mold growth on hard surfaces with water, detergent and, if necessary, 5% bleach (do not mix with other cleaners). Then  dry the area completely. If mold covers an area more than 10 square feet, consider hiring an indoor environmental professional. . For clothing, washing with soap and water is best. If moldy items cannot be  cleaned and dried, throw them away. . Remove sources e.g. contaminated carpets. . Repair and seal leaking roofs or pipes. Using dehumidifiers in damp basements may be helpful, but empty the water and clean units regularly to prevent mildew from forming. All rooms, especially basements, bathrooms and kitchens, require ventilation and cleaning to deter mold and mildew growth. Avoid carpeting on concrete or damp floors, and storing items in damp areas. Control of House Dust Mite Allergen . Dust mite allergens are a common trigger of allergy and asthma symptoms. While they can be found throughout the house, these microscopic creatures thrive in warm, humid environments such as bedding, upholstered furniture and carpeting. . Because so much time is spent in the bedroom, it is essential to reduce mite levels there.  . Encase pillows, mattresses, and box springs in special allergen-proof fabric covers or airtight, zippered plastic covers.  . Bedding should be washed weekly in hot water (130 F) and dried in a hot dryer. Allergen-proof covers are available for comforters and pillows that can't be regularly washed.  Sarah Leblanc the allergy-proof covers every few months. Minimize clutter in the bedroom. Keep pets out of the bedroom.  Marland Kitchen Keep humidity less than 50% by using a dehumidifier or air conditioning. You can buy a humidity measuring device called a hygrometer to monitor this.  . If possible, replace carpets with hardwood, linoleum, or washable area rugs. If that's not possible, vacuum frequently with a vacuum that has a HEPA filter. . Remove all upholstered furniture and non-washable window drapes from the bedroom. . Remove all non-washable stuffed toys from the bedroom.  Wash stuffed toys weekly. Pet Allergen Avoidance: . Contrary to popular opinion, there are no "hypoallergenic" breeds of dogs or cats. That is because people are not allergic to an animal's hair, but to an allergen found in the animal's  saliva, dander (dead skin flakes) or urine. Pet allergy symptoms typically occur within minutes. For some people, symptoms can build up and become most severe 8 to 12 hours after contact with the animal. People with severe allergies can experience reactions in public places if dander has been transported on the pet owners' clothing. Marland Kitchen Keeping an animal outdoors is only a partial solution, since homes with pets in the yard still have higher concentrations of animal allergens. . Before getting a pet, ask your allergist to determine if you are allergic to animals. If your pet is already considered part of your family, try to minimize contact and keep the pet out of the bedroom and other rooms where you spend a great deal of time. . As with dust mites, vacuum carpets often or replace carpet with a hardwood floor, tile or linoleum. . High-efficiency particulate air (HEPA) cleaners can reduce allergen levels over time. . While dander and saliva are the source of cat and dog allergens, urine is the source of allergens from rabbits, hamsters, mice and [place] pigs; so ask a non-allergic family member to clean the animal's cage. . If you have a pet allergy, talk to your allergist about the potential for allergy immunotherapy (allergy shots). This strategy can often provide long-term relief.

## 2020-04-05 NOTE — Assessment & Plan Note (Signed)
Past history - Patient avoiding a variety of foods including broccoli, lettuce, leafy greens, certain fresh fruits, gums, tree nuts and carrageenan. Symptoms include GI issues to perioral pruritus. 2021 skin testing showed: Borderline positive to walnuts, almond, hazelnut, pistachio, cabbage. Positive to peanuts. Interim history - no reactions.  Continue to avoid foods that are bothersome.  For mild symptoms you can take over the counter antihistamines such as Benadryl and monitor symptoms closely. If symptoms worsen or if you have severe symptoms including breathing issues, throat closure, significant swelling, whole body hives, severe diarrhea and vomiting, lightheadedness then inject epinephrine and seek immediate medical care afterwards.

## 2020-04-05 NOTE — Assessment & Plan Note (Signed)
Past history - Large localized reaction to fire ant bites. 2021 skin testing was negative to fire ant.  If having systemic symptoms in the future then will order fire ant IgE at that time.  

## 2020-04-12 ENCOUNTER — Other Ambulatory Visit: Payer: Self-pay

## 2020-04-12 ENCOUNTER — Ambulatory Visit (INDEPENDENT_AMBULATORY_CARE_PROVIDER_SITE_OTHER): Payer: BC Managed Care – PPO

## 2020-04-12 DIAGNOSIS — J309 Allergic rhinitis, unspecified: Secondary | ICD-10-CM | POA: Diagnosis not present

## 2020-04-19 ENCOUNTER — Other Ambulatory Visit: Payer: Self-pay

## 2020-04-19 ENCOUNTER — Ambulatory Visit (INDEPENDENT_AMBULATORY_CARE_PROVIDER_SITE_OTHER): Payer: BC Managed Care – PPO

## 2020-04-19 DIAGNOSIS — J309 Allergic rhinitis, unspecified: Secondary | ICD-10-CM | POA: Diagnosis not present

## 2020-04-26 ENCOUNTER — Other Ambulatory Visit: Payer: Self-pay

## 2020-04-26 ENCOUNTER — Ambulatory Visit (INDEPENDENT_AMBULATORY_CARE_PROVIDER_SITE_OTHER): Payer: BC Managed Care – PPO

## 2020-04-26 DIAGNOSIS — J309 Allergic rhinitis, unspecified: Secondary | ICD-10-CM

## 2020-05-03 ENCOUNTER — Ambulatory Visit (INDEPENDENT_AMBULATORY_CARE_PROVIDER_SITE_OTHER): Payer: BC Managed Care – PPO

## 2020-05-03 ENCOUNTER — Other Ambulatory Visit: Payer: Self-pay

## 2020-05-03 DIAGNOSIS — J309 Allergic rhinitis, unspecified: Secondary | ICD-10-CM

## 2020-05-10 ENCOUNTER — Ambulatory Visit (INDEPENDENT_AMBULATORY_CARE_PROVIDER_SITE_OTHER): Payer: BC Managed Care – PPO

## 2020-05-10 DIAGNOSIS — J309 Allergic rhinitis, unspecified: Secondary | ICD-10-CM

## 2020-05-17 ENCOUNTER — Other Ambulatory Visit: Payer: Self-pay

## 2020-05-17 ENCOUNTER — Ambulatory Visit (INDEPENDENT_AMBULATORY_CARE_PROVIDER_SITE_OTHER): Payer: BC Managed Care – PPO

## 2020-05-17 DIAGNOSIS — J309 Allergic rhinitis, unspecified: Secondary | ICD-10-CM

## 2020-05-24 ENCOUNTER — Other Ambulatory Visit: Payer: Self-pay

## 2020-05-24 ENCOUNTER — Ambulatory Visit (INDEPENDENT_AMBULATORY_CARE_PROVIDER_SITE_OTHER): Payer: BC Managed Care – PPO

## 2020-05-24 DIAGNOSIS — J309 Allergic rhinitis, unspecified: Secondary | ICD-10-CM | POA: Diagnosis not present

## 2020-06-02 ENCOUNTER — Other Ambulatory Visit: Payer: Self-pay

## 2020-06-02 ENCOUNTER — Ambulatory Visit (INDEPENDENT_AMBULATORY_CARE_PROVIDER_SITE_OTHER): Payer: BC Managed Care – PPO

## 2020-06-02 DIAGNOSIS — J309 Allergic rhinitis, unspecified: Secondary | ICD-10-CM | POA: Diagnosis not present

## 2020-06-07 ENCOUNTER — Other Ambulatory Visit: Payer: Self-pay

## 2020-06-07 ENCOUNTER — Ambulatory Visit (INDEPENDENT_AMBULATORY_CARE_PROVIDER_SITE_OTHER): Payer: BC Managed Care – PPO

## 2020-06-07 DIAGNOSIS — J309 Allergic rhinitis, unspecified: Secondary | ICD-10-CM | POA: Diagnosis not present

## 2020-06-14 ENCOUNTER — Ambulatory Visit (INDEPENDENT_AMBULATORY_CARE_PROVIDER_SITE_OTHER): Payer: BC Managed Care – PPO

## 2020-06-14 ENCOUNTER — Other Ambulatory Visit: Payer: Self-pay

## 2020-06-14 DIAGNOSIS — J309 Allergic rhinitis, unspecified: Secondary | ICD-10-CM

## 2020-06-21 ENCOUNTER — Other Ambulatory Visit: Payer: Self-pay

## 2020-06-21 ENCOUNTER — Ambulatory Visit (INDEPENDENT_AMBULATORY_CARE_PROVIDER_SITE_OTHER): Payer: BC Managed Care – PPO

## 2020-06-21 DIAGNOSIS — J309 Allergic rhinitis, unspecified: Secondary | ICD-10-CM

## 2020-07-04 DIAGNOSIS — E039 Hypothyroidism, unspecified: Secondary | ICD-10-CM | POA: Diagnosis not present

## 2020-07-04 DIAGNOSIS — E785 Hyperlipidemia, unspecified: Secondary | ICD-10-CM | POA: Diagnosis not present

## 2020-07-05 ENCOUNTER — Ambulatory Visit (INDEPENDENT_AMBULATORY_CARE_PROVIDER_SITE_OTHER): Payer: BC Managed Care – PPO

## 2020-07-05 ENCOUNTER — Other Ambulatory Visit: Payer: Self-pay

## 2020-07-05 DIAGNOSIS — J309 Allergic rhinitis, unspecified: Secondary | ICD-10-CM | POA: Diagnosis not present

## 2020-07-14 ENCOUNTER — Ambulatory Visit (INDEPENDENT_AMBULATORY_CARE_PROVIDER_SITE_OTHER): Payer: BC Managed Care – PPO

## 2020-07-14 ENCOUNTER — Other Ambulatory Visit: Payer: Self-pay

## 2020-07-14 DIAGNOSIS — J309 Allergic rhinitis, unspecified: Secondary | ICD-10-CM | POA: Diagnosis not present

## 2020-07-19 ENCOUNTER — Other Ambulatory Visit: Payer: Self-pay

## 2020-07-19 ENCOUNTER — Ambulatory Visit (INDEPENDENT_AMBULATORY_CARE_PROVIDER_SITE_OTHER): Payer: BC Managed Care – PPO

## 2020-07-19 DIAGNOSIS — J309 Allergic rhinitis, unspecified: Secondary | ICD-10-CM

## 2020-07-28 ENCOUNTER — Ambulatory Visit (INDEPENDENT_AMBULATORY_CARE_PROVIDER_SITE_OTHER): Payer: BC Managed Care – PPO

## 2020-07-28 ENCOUNTER — Other Ambulatory Visit: Payer: Self-pay

## 2020-07-28 DIAGNOSIS — J309 Allergic rhinitis, unspecified: Secondary | ICD-10-CM | POA: Diagnosis not present

## 2020-08-02 ENCOUNTER — Ambulatory Visit (INDEPENDENT_AMBULATORY_CARE_PROVIDER_SITE_OTHER): Payer: BC Managed Care – PPO

## 2020-08-02 ENCOUNTER — Other Ambulatory Visit: Payer: Self-pay

## 2020-08-02 DIAGNOSIS — J309 Allergic rhinitis, unspecified: Secondary | ICD-10-CM

## 2020-08-09 ENCOUNTER — Other Ambulatory Visit: Payer: Self-pay

## 2020-08-09 ENCOUNTER — Ambulatory Visit (INDEPENDENT_AMBULATORY_CARE_PROVIDER_SITE_OTHER): Payer: BC Managed Care – PPO

## 2020-08-09 DIAGNOSIS — J309 Allergic rhinitis, unspecified: Secondary | ICD-10-CM | POA: Diagnosis not present

## 2020-08-16 ENCOUNTER — Ambulatory Visit (INDEPENDENT_AMBULATORY_CARE_PROVIDER_SITE_OTHER): Payer: BC Managed Care – PPO

## 2020-08-16 ENCOUNTER — Other Ambulatory Visit: Payer: Self-pay

## 2020-08-16 DIAGNOSIS — J309 Allergic rhinitis, unspecified: Secondary | ICD-10-CM | POA: Diagnosis not present

## 2020-08-25 ENCOUNTER — Other Ambulatory Visit: Payer: Self-pay

## 2020-08-25 ENCOUNTER — Ambulatory Visit (INDEPENDENT_AMBULATORY_CARE_PROVIDER_SITE_OTHER): Payer: BC Managed Care – PPO

## 2020-08-25 DIAGNOSIS — J309 Allergic rhinitis, unspecified: Secondary | ICD-10-CM | POA: Diagnosis not present

## 2020-08-30 ENCOUNTER — Other Ambulatory Visit: Payer: Self-pay

## 2020-08-30 ENCOUNTER — Ambulatory Visit (INDEPENDENT_AMBULATORY_CARE_PROVIDER_SITE_OTHER): Payer: BC Managed Care – PPO

## 2020-08-30 DIAGNOSIS — J309 Allergic rhinitis, unspecified: Secondary | ICD-10-CM | POA: Diagnosis not present

## 2020-08-30 MED ORDER — MONTELUKAST SODIUM 10 MG PO TABS
10.0000 mg | ORAL_TABLET | Freq: Every day | ORAL | 0 refills | Status: DC
Start: 2020-08-30 — End: 2020-09-01

## 2020-09-01 ENCOUNTER — Other Ambulatory Visit: Payer: Self-pay

## 2020-09-01 MED ORDER — MONTELUKAST SODIUM 10 MG PO TABS
10.0000 mg | ORAL_TABLET | Freq: Every day | ORAL | 0 refills | Status: DC
Start: 2020-09-01 — End: 2020-12-05

## 2020-09-06 ENCOUNTER — Ambulatory Visit (INDEPENDENT_AMBULATORY_CARE_PROVIDER_SITE_OTHER): Payer: BC Managed Care – PPO

## 2020-09-06 ENCOUNTER — Other Ambulatory Visit: Payer: Self-pay

## 2020-09-06 DIAGNOSIS — J309 Allergic rhinitis, unspecified: Secondary | ICD-10-CM | POA: Diagnosis not present

## 2020-09-07 DIAGNOSIS — E785 Hyperlipidemia, unspecified: Secondary | ICD-10-CM | POA: Diagnosis not present

## 2020-09-07 DIAGNOSIS — E039 Hypothyroidism, unspecified: Secondary | ICD-10-CM | POA: Diagnosis not present

## 2020-09-13 ENCOUNTER — Other Ambulatory Visit: Payer: Self-pay

## 2020-09-13 ENCOUNTER — Ambulatory Visit (INDEPENDENT_AMBULATORY_CARE_PROVIDER_SITE_OTHER): Payer: BC Managed Care – PPO

## 2020-09-13 DIAGNOSIS — J309 Allergic rhinitis, unspecified: Secondary | ICD-10-CM

## 2020-09-13 NOTE — Progress Notes (Signed)
VIALS EXP 09-13-21 

## 2020-09-14 DIAGNOSIS — J3089 Other allergic rhinitis: Secondary | ICD-10-CM | POA: Diagnosis not present

## 2020-09-20 ENCOUNTER — Ambulatory Visit (INDEPENDENT_AMBULATORY_CARE_PROVIDER_SITE_OTHER): Payer: BC Managed Care – PPO

## 2020-09-20 ENCOUNTER — Other Ambulatory Visit: Payer: Self-pay

## 2020-09-20 DIAGNOSIS — J309 Allergic rhinitis, unspecified: Secondary | ICD-10-CM

## 2020-09-27 ENCOUNTER — Ambulatory Visit (INDEPENDENT_AMBULATORY_CARE_PROVIDER_SITE_OTHER): Payer: BC Managed Care – PPO

## 2020-09-27 ENCOUNTER — Other Ambulatory Visit: Payer: Self-pay

## 2020-09-27 DIAGNOSIS — J309 Allergic rhinitis, unspecified: Secondary | ICD-10-CM | POA: Diagnosis not present

## 2020-09-28 NOTE — Progress Notes (Signed)
Follow Up Note  RE: Sarah Leblanc MRN: [id number] DOB: Oct 26, 1975 Date of Office Visit: 09/29/2020  Referring provider: Ayesha Rumpf, FNP Primary care provider: Sigmund Hazel, MD  Chief Complaint: follow up  History of Present Illness: I had the pleasure of seeing Sarah Leblanc for a follow up visit at the [facility] on 09/29/2020. She is a 45 y.o. female, who is being followed for allergic rhinoconjunctivitis on AIT, oral allergy syndrome and history of fire ant reaction. Her previous allergy office visit was on 04/05/2020 with Dr. Selena Leblanc. Today is a regular follow up visit.  Seasonal and perennial allergic rhinoconjunctivitis Some localized reactions with the injections and noticed some benefit. Currently taking Claritin daily and use Singulair as needed around the times of injections.  Not needing to use eye drops or nasal sprays.   No itchy eyes and has been eating fresh apples with no issues. Did not try bananas yet.   Insect sting No stings since the last visit.  Assessment and Plan: Sarah Leblanc is a 45 y.o. female with: Seasonal and perennial allergic rhinoconjunctivitis Past history - Rhinoconjunctivitis symptoms for the past 40+ years mainly from the spring through fall. 2021 skin testing showed: Positive to grass, weed, ragweed, trees, mold, dust mites, cat, cockroach. Started AIT on 01/14/2020 (G-W-RW-T & M-DM-C-CR) Interim history - noticing benefit, some localized reactions.   Continue allergy injections.  Take Singulair 10mg  at night as needed.   Continue environmental control measures as below.  May use over the counter antihistamines such as Zyrtec (cetirizine), Claritin (loratadine), Allegra (fexofenadine), or Xyzal (levocetirizine) daily as needed. May take it twice a day if needed.  May use Flonase (fluticasone) nasal spray 1 spray per nostril twice a day as needed for nasal congestion.   Nasal saline spray (i.e., Simply Saline) or nasal saline  lavage (i.e., NeilMed) is recommended as needed and prior to medicated nasal sprays.  Pollen-food allergy Past history - Patient avoiding a variety of foods including broccoli, lettuce, leafy greens, certain fresh fruits, gums, tree nuts and carrageenan. Symptoms include GI issues to perioral pruritus. 2021 skin testing showed: Borderline positive to walnuts, almond, hazelnut, pistachio, cabbage. Positive to peanuts. Interim history - no reactions, tried apples with no issues.   Continue to avoid foods that are bothersome.  For mild symptoms you can take over the counter antihistamines such as Benadryl and monitor symptoms closely. If symptoms worsen or if you have severe symptoms including breathing issues, throat closure, significant swelling, whole body hives, severe diarrhea and vomiting, lightheadedness then inject epinephrine and seek immediate medical care afterwards.  Insect sting Past history - Large localized reaction to fire ant bites. 2021 skin testing was negative to fire ant.   If having systemic symptoms in the future then will order fire ant IgE at that time.   Return in about 1 year (around 09/29/2021).  No orders of the defined types were placed in this encounter.  Lab Orders  No laboratory test(s) ordered today    Diagnostics: None.  Medication List:  Current Outpatient Medications  Medication Sig Dispense Refill  . AUVI-Q 0.3 MG/0.3ML SOAJ injection Inject into the muscle as directed.     . calcium-vitamin D (OSCAL WITH D) 500-200 MG-UNIT tablet Take 1 tablet by mouth.     . cetirizine (ZYRTEC) 10 MG tablet Take 10 mg by mouth daily.     . famotidine (PEPCID) 10 MG tablet Take 10 mg by mouth 2 (two) times daily.     10/01/2021  levothyroxine (SYNTHROID) 75 MCG tablet Take 75 mcg by mouth daily.     . montelukast (SINGULAIR) 10 MG tablet Take 1 tablet (10 mg total) by mouth at bedtime. (Patient taking differently: Take 10 mg by mouth at bedtime as needed. Monday, Tuesday and  Wednesday.) 30 tablet 0  . Multiple Vitamin (MULTIVITAMIN) capsule Take 1 capsule by mouth daily.     Marland Kitchen atorvastatin (LIPITOR) 20 MG tablet Take 20 mg by mouth daily.  (Patient not taking: Reported on 09/29/2020)    . levothyroxine (SYNTHROID) 50 MCG tablet Take 50 mcg by mouth daily before breakfast.  (Patient not taking: Reported on 09/29/2020)     No current facility-administered medications for this visit.   Allergies: Allergies  Allergen Reactions  . Other Hives   I reviewed her past medical history, social history, family history, and environmental history and no significant changes have been reported from her previous visit.  Review of Systems  Constitutional: Negative for appetite change, chills, fever and unexpected weight change.  HENT: Negative for congestion and rhinorrhea.   Eyes: Negative for itching.  Respiratory: Negative for cough, chest tightness, shortness of breath and wheezing.   Cardiovascular: Negative for chest pain.  Gastrointestinal: Negative for abdominal pain.  Genitourinary: Negative for difficulty urinating.  Skin: Negative for rash.  Allergic/Immunologic: Positive for environmental allergies and food allergies.  Neurological: Negative for headaches.   Objective: BP 110/72   Pulse 82   Resp 16   Ht 5\' 2"  (1.575 m)   Wt 127 lb 12 oz (57.9 kg)   SpO2 99%   BMI 23.37 kg/m  Body mass index is 23.37 kg/m. Physical Exam Vitals and nursing note reviewed.  Constitutional:      Appearance: Normal appearance. She is well-developed.  HENT:     Head: Normocephalic and atraumatic.     Right Ear: Tympanic membrane and external ear normal.     Left Ear: Tympanic membrane and external ear normal.     Nose: Nose normal. No rhinorrhea.     Mouth/Throat:     Mouth: Mucous membranes are moist.     Pharynx: Oropharynx is clear.  Eyes:     Conjunctiva/sclera: Conjunctivae normal.  Cardiovascular:     Rate and Rhythm: Normal rate and regular rhythm.     Heart  sounds: Normal heart sounds. No murmur heard. No friction rub. No gallop.   Pulmonary:     Effort: Pulmonary effort is normal.     Breath sounds: Normal breath sounds. No wheezing, rhonchi or rales.  Musculoskeletal:     Cervical back: Neck supple.  Skin:    General: Skin is warm.     Findings: No rash.  Neurological:     Mental Status: She is alert and oriented to person, place, and time.  Psychiatric:        Behavior: Behavior normal.    Previous notes and tests were reviewed. The plan was reviewed with the patient/family, and all questions/concerned were addressed.  It was my pleasure to see Sarah Leblanc today and participate in her care. Please feel free to contact me with any questions or concerns.  Sincerely,  Maralyn Sago, DO Allergy & Immunology  [facility] office: [phone] [facility] office: [phone]

## 2020-09-29 ENCOUNTER — Other Ambulatory Visit: Payer: Self-pay

## 2020-09-29 ENCOUNTER — Encounter: Payer: Self-pay | Admitting: Allergy

## 2020-09-29 ENCOUNTER — Ambulatory Visit (INDEPENDENT_AMBULATORY_CARE_PROVIDER_SITE_OTHER): Payer: BC Managed Care – PPO | Admitting: Allergy

## 2020-09-29 VITALS — BP 110/72 | HR 82 | Resp 16 | Ht 62.0 in | Wt 127.8 lb

## 2020-09-29 DIAGNOSIS — J3089 Other allergic rhinitis: Secondary | ICD-10-CM

## 2020-09-29 DIAGNOSIS — T781XXD Other adverse food reactions, not elsewhere classified, subsequent encounter: Secondary | ICD-10-CM

## 2020-09-29 DIAGNOSIS — H1010 Acute atopic conjunctivitis, unspecified eye: Secondary | ICD-10-CM

## 2020-09-29 DIAGNOSIS — H1013 Acute atopic conjunctivitis, bilateral: Secondary | ICD-10-CM | POA: Diagnosis not present

## 2020-09-29 DIAGNOSIS — J302 Other seasonal allergic rhinitis: Secondary | ICD-10-CM | POA: Diagnosis not present

## 2020-09-29 DIAGNOSIS — T63481D Toxic effect of venom of other arthropod, accidental (unintentional), subsequent encounter: Secondary | ICD-10-CM

## 2020-09-29 NOTE — Assessment & Plan Note (Signed)
Past history - Rhinoconjunctivitis symptoms for the past 40+ years mainly from the spring through fall. 2021 skin testing showed: Positive to grass, weed, ragweed, trees, mold, dust mites, cat, cockroach. Started AIT on 01/14/2020 (G-W-RW-T & M-DM-C-CR) Interim history - noticing benefit, some localized reactions.   Continue allergy injections.  Take Singulair 10mg  at night as needed.   Continue environmental control measures as below.  May use over the counter antihistamines such as Zyrtec (cetirizine), Claritin (loratadine), Allegra (fexofenadine), or Xyzal (levocetirizine) daily as needed. May take it twice a day if needed.  May use Flonase (fluticasone) nasal spray 1 spray per nostril twice a day as needed for nasal congestion.   Nasal saline spray (i.e., Simply Saline) or nasal saline lavage (i.e., NeilMed) is recommended as needed and prior to medicated nasal sprays.

## 2020-09-29 NOTE — Assessment & Plan Note (Signed)
Past history - Large localized reaction to fire ant bites. 2021 skin testing was negative to fire ant.  If having systemic symptoms in the future then will order fire ant IgE at that time.  

## 2020-09-29 NOTE — Assessment & Plan Note (Signed)
Past history - Patient avoiding a variety of foods including broccoli, lettuce, leafy greens, certain fresh fruits, gums, tree nuts and carrageenan. Symptoms include GI issues to perioral pruritus. 2021 skin testing showed: Borderline positive to walnuts, almond, hazelnut, pistachio, cabbage. Positive to peanuts. Interim history - no reactions, tried apples with no issues.   Continue to avoid foods that are bothersome.  For mild symptoms you can take over the counter antihistamines such as Benadryl and monitor symptoms closely. If symptoms worsen or if you have severe symptoms including breathing issues, throat closure, significant swelling, whole body hives, severe diarrhea and vomiting, lightheadedness then inject epinephrine and seek immediate medical care afterwards.

## 2020-09-29 NOTE — Patient Instructions (Addendum)
Environmental allergies 2021 skin testing positive to grass, weed, ragweed, trees, mold, dust mites, cat, cockroach.   Continue allergy injections.  Take Singulair 10mg  at night as needed.   Continue environmental control measures as below.  May use over the counter antihistamines such as Zyrtec (cetirizine), Claritin (loratadine), Allegra (fexofenadine), or Xyzal (levocetirizine) daily as needed. May take it twice a day if needed.  May use Flonase (fluticasone) nasal spray 1 spray per nostril twice a day as needed for nasal congestion.   Nasal saline spray (i.e., Simply Saline) or nasal saline lavage (i.e., NeilMed) is recommended as needed and prior to medicated nasal sprays.  Food:   Continue to avoid foods that are bothersome.  For mild symptoms you can take over the counter antihistamines such as Benadryl and monitor symptoms closely. If symptoms worsen or if you have severe symptoms including breathing issues, throat closure, significant swelling, whole body hives, severe diarrhea and vomiting, lightheadedness then inject epinephrine and seek immediate medical care afterwards.  Insect stings:  Negative skin testing to fire ant.  If you notice any worsening symptoms. Let know and we can order the bloodwork.  Follow up in 12 months or sooner if needed.   Reducing Pollen Exposure . Pollen seasons: trees (spring), grass (summer) and ragweed/weeds (fall). 09-20-1980 Keep windows closed in your home and car to lower pollen exposure.  Marland Kitchen air conditioning in the bedroom and throughout the house if possible.  . Avoid going out in dry windy days - especially early morning. . Pollen counts are highest between 5 - 10 AM and on dry, hot and windy days.  . Save outside activities for late afternoon or after a heavy rain, when pollen levels are lower.  . Avoid mowing of grass if you have grass pollen allergy. Lilian Kapur Be aware that pollen can also be transported indoors on people and pets.   . Dry your clothes in an automatic dryer rather than hanging them outside where they might collect pollen.  . Rinse hair and eyes before bedtime. Mold Control . Mold and fungi can grow on a variety of surfaces provided certain temperature and moisture conditions exist.  . Outdoor molds grow on plants, decaying vegetation and soil. The major outdoor mold, Alternaria and Cladosporium, are found in very high numbers during hot and dry conditions. Generally, a late summer - fall peak is seen for common outdoor fungal spores. Rain will temporarily lower outdoor mold spore count, but counts rise rapidly when the rainy period ends. . The most important indoor molds are Aspergillus and Penicillium. Dark, humid and poorly ventilated basements are ideal sites for mold growth. The next most common sites of mold growth are the bathroom and the kitchen. Outdoor (Seasonal) Mold Control . Use air conditioning and keep windows closed. . Avoid exposure to decaying vegetation. 03-09-1976 Avoid leaf raking. . Avoid grain handling. . Consider wearing a face mask if working in moldy areas.  Indoor (Perennial) Mold Control  . Maintain humidity below 50%. . Get rid of mold growth on hard surfaces with water, detergent and, if necessary, 5% bleach (do not mix with other cleaners). Then dry the area completely. If mold covers an area more than 10 square feet, consider hiring an indoor environmental professional. . For clothing, washing with soap and water is best. If moldy items cannot be cleaned and dried, throw them away. . Remove sources e.g. contaminated carpets. . Repair and seal leaking roofs or pipes. Using dehumidifiers in damp basements may  be helpful, but empty the water and clean units regularly to prevent mildew from forming. All rooms, especially basements, bathrooms and kitchens, require ventilation and cleaning to deter mold and mildew growth. Avoid carpeting on concrete or damp floors, and storing items in damp  areas. Control of House Dust Mite Allergen . Dust mite allergens are a common trigger of allergy and asthma symptoms. While they can be found throughout the house, these microscopic creatures thrive in warm, humid environments such as bedding, upholstered furniture and carpeting. . Because so much time is spent in the bedroom, it is essential to reduce mite levels there.  . Encase pillows, mattresses, and box springs in special allergen-proof fabric covers or airtight, zippered plastic covers.  . Bedding should be washed weekly in hot water (130 F) and dried in a hot dryer. Allergen-proof covers are available for comforters and pillows that can't be regularly washed.  Reyes Ivan the allergy-proof covers every few months. Minimize clutter in the bedroom. Keep pets out of the bedroom.  Marland Kitchen Keep humidity less than 50% by using a dehumidifier or air conditioning. You can buy a humidity measuring device called a hygrometer to monitor this.  . If possible, replace carpets with hardwood, linoleum, or washable area rugs. If that's not possible, vacuum frequently with a vacuum that has a HEPA filter. . Remove all upholstered furniture and non-washable window drapes from the bedroom. . Remove all non-washable stuffed toys from the bedroom.  Wash stuffed toys weekly. Pet Allergen Avoidance: . Contrary to popular opinion, there are no "hypoallergenic" breeds of dogs or cats. That is because people are not allergic to an animal's hair, but to an allergen found in the animal's saliva, dander (dead skin flakes) or urine. Pet allergy symptoms typically occur within minutes. For some people, symptoms can build up and become most severe 8 to 12 hours after contact with the animal. People with severe allergies can experience reactions in public places if dander has been transported on the pet owners' clothing. Marland Kitchen Keeping an animal outdoors is only a partial solution, since homes with pets in the yard still have higher  concentrations of animal allergens. . Before getting a pet, ask your allergist to determine if you are allergic to animals. If your pet is already considered part of your family, try to minimize contact and keep the pet out of the bedroom and other rooms where you spend a great deal of time. . As with dust mites, vacuum carpets often or replace carpet with a hardwood floor, tile or linoleum. . High-efficiency particulate air (HEPA) cleaners can reduce allergen levels over time. . While dander and saliva are the source of cat and dog allergens, urine is the source of allergens from rabbits, hamsters, mice and [place] pigs; so ask a non-allergic family member to clean the animal's cage. . If you have a pet allergy, talk to your allergist about the potential for allergy immunotherapy (allergy shots). This strategy can often provide long-term relief.

## 2020-10-04 ENCOUNTER — Other Ambulatory Visit: Payer: Self-pay

## 2020-10-04 ENCOUNTER — Ambulatory Visit (INDEPENDENT_AMBULATORY_CARE_PROVIDER_SITE_OTHER): Payer: BC Managed Care – PPO

## 2020-10-04 DIAGNOSIS — J309 Allergic rhinitis, unspecified: Secondary | ICD-10-CM

## 2020-10-11 ENCOUNTER — Ambulatory Visit (INDEPENDENT_AMBULATORY_CARE_PROVIDER_SITE_OTHER): Payer: BC Managed Care – PPO

## 2020-10-11 ENCOUNTER — Other Ambulatory Visit: Payer: Self-pay

## 2020-10-11 DIAGNOSIS — J309 Allergic rhinitis, unspecified: Secondary | ICD-10-CM

## 2020-10-18 ENCOUNTER — Ambulatory Visit (INDEPENDENT_AMBULATORY_CARE_PROVIDER_SITE_OTHER): Payer: BC Managed Care – PPO

## 2020-10-18 ENCOUNTER — Other Ambulatory Visit: Payer: Self-pay

## 2020-10-18 DIAGNOSIS — J309 Allergic rhinitis, unspecified: Secondary | ICD-10-CM | POA: Diagnosis not present

## 2020-10-25 ENCOUNTER — Other Ambulatory Visit: Payer: Self-pay

## 2020-10-25 ENCOUNTER — Ambulatory Visit (INDEPENDENT_AMBULATORY_CARE_PROVIDER_SITE_OTHER): Payer: BC Managed Care – PPO

## 2020-10-25 DIAGNOSIS — J309 Allergic rhinitis, unspecified: Secondary | ICD-10-CM | POA: Diagnosis not present

## 2020-11-01 ENCOUNTER — Ambulatory Visit (INDEPENDENT_AMBULATORY_CARE_PROVIDER_SITE_OTHER): Payer: BC Managed Care – PPO

## 2020-11-01 ENCOUNTER — Other Ambulatory Visit: Payer: Self-pay

## 2020-11-01 DIAGNOSIS — J309 Allergic rhinitis, unspecified: Secondary | ICD-10-CM

## 2020-11-08 ENCOUNTER — Ambulatory Visit (INDEPENDENT_AMBULATORY_CARE_PROVIDER_SITE_OTHER): Payer: BC Managed Care – PPO

## 2020-11-08 ENCOUNTER — Other Ambulatory Visit: Payer: Self-pay

## 2020-11-08 DIAGNOSIS — J309 Allergic rhinitis, unspecified: Secondary | ICD-10-CM

## 2020-11-15 ENCOUNTER — Ambulatory Visit (INDEPENDENT_AMBULATORY_CARE_PROVIDER_SITE_OTHER): Payer: BC Managed Care – PPO

## 2020-11-15 ENCOUNTER — Other Ambulatory Visit: Payer: Self-pay

## 2020-11-15 DIAGNOSIS — J309 Allergic rhinitis, unspecified: Secondary | ICD-10-CM | POA: Diagnosis not present

## 2020-11-24 ENCOUNTER — Ambulatory Visit (INDEPENDENT_AMBULATORY_CARE_PROVIDER_SITE_OTHER): Payer: BC Managed Care – PPO

## 2020-11-24 ENCOUNTER — Other Ambulatory Visit: Payer: Self-pay

## 2020-11-24 DIAGNOSIS — J309 Allergic rhinitis, unspecified: Secondary | ICD-10-CM | POA: Diagnosis not present

## 2020-12-01 ENCOUNTER — Other Ambulatory Visit: Payer: Self-pay

## 2020-12-01 ENCOUNTER — Ambulatory Visit (INDEPENDENT_AMBULATORY_CARE_PROVIDER_SITE_OTHER): Payer: BC Managed Care – PPO

## 2020-12-01 DIAGNOSIS — J309 Allergic rhinitis, unspecified: Secondary | ICD-10-CM

## 2020-12-04 ENCOUNTER — Other Ambulatory Visit: Payer: Self-pay | Admitting: Allergy

## 2020-12-05 NOTE — Telephone Encounter (Signed)
Patient is not due back until May of 2023

## 2020-12-06 ENCOUNTER — Ambulatory Visit (INDEPENDENT_AMBULATORY_CARE_PROVIDER_SITE_OTHER): Payer: BC Managed Care – PPO

## 2020-12-06 ENCOUNTER — Other Ambulatory Visit: Payer: Self-pay

## 2020-12-06 DIAGNOSIS — J309 Allergic rhinitis, unspecified: Secondary | ICD-10-CM | POA: Diagnosis not present

## 2020-12-13 ENCOUNTER — Other Ambulatory Visit: Payer: Self-pay

## 2020-12-13 ENCOUNTER — Ambulatory Visit (INDEPENDENT_AMBULATORY_CARE_PROVIDER_SITE_OTHER): Payer: BC Managed Care – PPO

## 2020-12-13 DIAGNOSIS — J309 Allergic rhinitis, unspecified: Secondary | ICD-10-CM

## 2020-12-13 NOTE — Progress Notes (Signed)
VIALS MADE. EXP 12-13-21 

## 2020-12-14 DIAGNOSIS — J301 Allergic rhinitis due to pollen: Secondary | ICD-10-CM | POA: Diagnosis not present

## 2020-12-20 ENCOUNTER — Other Ambulatory Visit: Payer: Self-pay

## 2020-12-20 ENCOUNTER — Ambulatory Visit (INDEPENDENT_AMBULATORY_CARE_PROVIDER_SITE_OTHER): Payer: BC Managed Care – PPO

## 2020-12-20 DIAGNOSIS — J309 Allergic rhinitis, unspecified: Secondary | ICD-10-CM | POA: Diagnosis not present

## 2020-12-27 ENCOUNTER — Ambulatory Visit (INDEPENDENT_AMBULATORY_CARE_PROVIDER_SITE_OTHER): Payer: BC Managed Care – PPO

## 2020-12-27 ENCOUNTER — Other Ambulatory Visit: Payer: Self-pay

## 2020-12-27 DIAGNOSIS — J309 Allergic rhinitis, unspecified: Secondary | ICD-10-CM

## 2021-01-10 ENCOUNTER — Ambulatory Visit (INDEPENDENT_AMBULATORY_CARE_PROVIDER_SITE_OTHER): Payer: BC Managed Care – PPO

## 2021-01-10 ENCOUNTER — Other Ambulatory Visit: Payer: Self-pay

## 2021-01-10 DIAGNOSIS — J309 Allergic rhinitis, unspecified: Secondary | ICD-10-CM | POA: Diagnosis not present

## 2021-01-17 ENCOUNTER — Other Ambulatory Visit: Payer: Self-pay

## 2021-01-17 ENCOUNTER — Ambulatory Visit (INDEPENDENT_AMBULATORY_CARE_PROVIDER_SITE_OTHER): Payer: BC Managed Care – PPO

## 2021-01-17 DIAGNOSIS — J309 Allergic rhinitis, unspecified: Secondary | ICD-10-CM | POA: Diagnosis not present

## 2021-01-24 ENCOUNTER — Other Ambulatory Visit: Payer: Self-pay

## 2021-01-24 ENCOUNTER — Ambulatory Visit (INDEPENDENT_AMBULATORY_CARE_PROVIDER_SITE_OTHER): Payer: BC Managed Care – PPO

## 2021-01-24 DIAGNOSIS — J309 Allergic rhinitis, unspecified: Secondary | ICD-10-CM | POA: Diagnosis not present

## 2021-01-31 ENCOUNTER — Ambulatory Visit (INDEPENDENT_AMBULATORY_CARE_PROVIDER_SITE_OTHER): Payer: BC Managed Care – PPO

## 2021-01-31 ENCOUNTER — Other Ambulatory Visit: Payer: Self-pay

## 2021-01-31 DIAGNOSIS — J309 Allergic rhinitis, unspecified: Secondary | ICD-10-CM

## 2021-02-07 ENCOUNTER — Ambulatory Visit (INDEPENDENT_AMBULATORY_CARE_PROVIDER_SITE_OTHER): Payer: BC Managed Care – PPO

## 2021-02-07 ENCOUNTER — Other Ambulatory Visit: Payer: Self-pay

## 2021-02-07 DIAGNOSIS — J309 Allergic rhinitis, unspecified: Secondary | ICD-10-CM | POA: Diagnosis not present

## 2021-02-14 ENCOUNTER — Other Ambulatory Visit: Payer: Self-pay

## 2021-02-14 ENCOUNTER — Ambulatory Visit (INDEPENDENT_AMBULATORY_CARE_PROVIDER_SITE_OTHER): Payer: BC Managed Care – PPO

## 2021-02-14 DIAGNOSIS — J309 Allergic rhinitis, unspecified: Secondary | ICD-10-CM | POA: Diagnosis not present

## 2021-02-17 DIAGNOSIS — J3089 Other allergic rhinitis: Secondary | ICD-10-CM | POA: Diagnosis not present

## 2021-02-17 NOTE — Progress Notes (Signed)
EXP 02/17/22 

## 2021-02-21 ENCOUNTER — Ambulatory Visit (INDEPENDENT_AMBULATORY_CARE_PROVIDER_SITE_OTHER): Payer: BC Managed Care – PPO

## 2021-02-21 ENCOUNTER — Other Ambulatory Visit: Payer: Self-pay

## 2021-02-21 DIAGNOSIS — J309 Allergic rhinitis, unspecified: Secondary | ICD-10-CM | POA: Diagnosis not present

## 2021-02-28 ENCOUNTER — Ambulatory Visit (INDEPENDENT_AMBULATORY_CARE_PROVIDER_SITE_OTHER): Payer: BC Managed Care – PPO

## 2021-02-28 ENCOUNTER — Other Ambulatory Visit: Payer: Self-pay

## 2021-02-28 DIAGNOSIS — J309 Allergic rhinitis, unspecified: Secondary | ICD-10-CM | POA: Diagnosis not present

## 2021-03-09 ENCOUNTER — Ambulatory Visit (INDEPENDENT_AMBULATORY_CARE_PROVIDER_SITE_OTHER): Payer: BC Managed Care – PPO

## 2021-03-09 ENCOUNTER — Other Ambulatory Visit: Payer: Self-pay

## 2021-03-09 DIAGNOSIS — J309 Allergic rhinitis, unspecified: Secondary | ICD-10-CM | POA: Diagnosis not present

## 2021-03-16 ENCOUNTER — Other Ambulatory Visit: Payer: Self-pay

## 2021-03-16 ENCOUNTER — Ambulatory Visit (INDEPENDENT_AMBULATORY_CARE_PROVIDER_SITE_OTHER): Payer: BC Managed Care – PPO

## 2021-03-16 DIAGNOSIS — J309 Allergic rhinitis, unspecified: Secondary | ICD-10-CM | POA: Diagnosis not present

## 2021-03-21 ENCOUNTER — Other Ambulatory Visit: Payer: Self-pay

## 2021-03-21 ENCOUNTER — Ambulatory Visit (INDEPENDENT_AMBULATORY_CARE_PROVIDER_SITE_OTHER): Payer: BC Managed Care – PPO

## 2021-03-21 DIAGNOSIS — J309 Allergic rhinitis, unspecified: Secondary | ICD-10-CM | POA: Diagnosis not present

## 2021-03-28 ENCOUNTER — Ambulatory Visit (INDEPENDENT_AMBULATORY_CARE_PROVIDER_SITE_OTHER): Payer: BC Managed Care – PPO

## 2021-03-28 ENCOUNTER — Other Ambulatory Visit: Payer: Self-pay

## 2021-03-28 DIAGNOSIS — J309 Allergic rhinitis, unspecified: Secondary | ICD-10-CM | POA: Diagnosis not present

## 2021-04-04 ENCOUNTER — Other Ambulatory Visit: Payer: Self-pay

## 2021-04-04 ENCOUNTER — Ambulatory Visit (INDEPENDENT_AMBULATORY_CARE_PROVIDER_SITE_OTHER): Payer: BC Managed Care – PPO

## 2021-04-04 DIAGNOSIS — J309 Allergic rhinitis, unspecified: Secondary | ICD-10-CM

## 2021-04-11 ENCOUNTER — Ambulatory Visit (INDEPENDENT_AMBULATORY_CARE_PROVIDER_SITE_OTHER): Payer: BC Managed Care – PPO

## 2021-04-11 ENCOUNTER — Other Ambulatory Visit: Payer: Self-pay

## 2021-04-11 DIAGNOSIS — J309 Allergic rhinitis, unspecified: Secondary | ICD-10-CM | POA: Diagnosis not present

## 2021-04-27 ENCOUNTER — Ambulatory Visit (INDEPENDENT_AMBULATORY_CARE_PROVIDER_SITE_OTHER): Payer: BC Managed Care – PPO

## 2021-04-27 ENCOUNTER — Other Ambulatory Visit: Payer: Self-pay

## 2021-04-27 DIAGNOSIS — J309 Allergic rhinitis, unspecified: Secondary | ICD-10-CM

## 2021-05-09 ENCOUNTER — Ambulatory Visit (INDEPENDENT_AMBULATORY_CARE_PROVIDER_SITE_OTHER): Payer: BC Managed Care – PPO

## 2021-05-09 ENCOUNTER — Other Ambulatory Visit: Payer: Self-pay

## 2021-05-09 DIAGNOSIS — J309 Allergic rhinitis, unspecified: Secondary | ICD-10-CM

## 2021-05-25 ENCOUNTER — Ambulatory Visit (INDEPENDENT_AMBULATORY_CARE_PROVIDER_SITE_OTHER): Payer: BC Managed Care – PPO

## 2021-05-25 ENCOUNTER — Other Ambulatory Visit: Payer: Self-pay

## 2021-05-25 DIAGNOSIS — J309 Allergic rhinitis, unspecified: Secondary | ICD-10-CM

## 2021-06-06 ENCOUNTER — Ambulatory Visit (INDEPENDENT_AMBULATORY_CARE_PROVIDER_SITE_OTHER): Payer: BC Managed Care – PPO

## 2021-06-06 ENCOUNTER — Other Ambulatory Visit: Payer: Self-pay

## 2021-06-06 DIAGNOSIS — J309 Allergic rhinitis, unspecified: Secondary | ICD-10-CM | POA: Diagnosis not present

## 2021-06-12 DIAGNOSIS — J3089 Other allergic rhinitis: Secondary | ICD-10-CM | POA: Diagnosis not present

## 2021-06-12 NOTE — Progress Notes (Signed)
VIALS EXP 06-12-22 °

## 2021-06-27 ENCOUNTER — Ambulatory Visit (INDEPENDENT_AMBULATORY_CARE_PROVIDER_SITE_OTHER): Payer: BC Managed Care – PPO

## 2021-06-27 ENCOUNTER — Other Ambulatory Visit: Payer: Self-pay

## 2021-06-27 DIAGNOSIS — J309 Allergic rhinitis, unspecified: Secondary | ICD-10-CM | POA: Diagnosis not present

## 2021-07-13 ENCOUNTER — Other Ambulatory Visit: Payer: Self-pay

## 2021-07-13 ENCOUNTER — Ambulatory Visit (INDEPENDENT_AMBULATORY_CARE_PROVIDER_SITE_OTHER): Payer: BC Managed Care – PPO

## 2021-07-13 DIAGNOSIS — J309 Allergic rhinitis, unspecified: Secondary | ICD-10-CM | POA: Diagnosis not present

## 2021-07-20 ENCOUNTER — Other Ambulatory Visit: Payer: Self-pay

## 2021-07-20 ENCOUNTER — Ambulatory Visit (INDEPENDENT_AMBULATORY_CARE_PROVIDER_SITE_OTHER): Payer: BC Managed Care – PPO

## 2021-07-20 DIAGNOSIS — J309 Allergic rhinitis, unspecified: Secondary | ICD-10-CM | POA: Diagnosis not present

## 2021-08-01 ENCOUNTER — Ambulatory Visit (INDEPENDENT_AMBULATORY_CARE_PROVIDER_SITE_OTHER): Payer: BC Managed Care – PPO | Admitting: *Deleted

## 2021-08-01 ENCOUNTER — Other Ambulatory Visit: Payer: Self-pay

## 2021-08-01 DIAGNOSIS — J309 Allergic rhinitis, unspecified: Secondary | ICD-10-CM

## 2021-08-08 ENCOUNTER — Other Ambulatory Visit: Payer: Self-pay

## 2021-08-08 ENCOUNTER — Ambulatory Visit (INDEPENDENT_AMBULATORY_CARE_PROVIDER_SITE_OTHER): Payer: BC Managed Care – PPO

## 2021-08-08 DIAGNOSIS — J309 Allergic rhinitis, unspecified: Secondary | ICD-10-CM | POA: Diagnosis not present

## 2021-08-15 ENCOUNTER — Ambulatory Visit (INDEPENDENT_AMBULATORY_CARE_PROVIDER_SITE_OTHER): Payer: BC Managed Care – PPO

## 2021-08-15 DIAGNOSIS — J309 Allergic rhinitis, unspecified: Secondary | ICD-10-CM

## 2021-08-24 DIAGNOSIS — E039 Hypothyroidism, unspecified: Secondary | ICD-10-CM | POA: Diagnosis not present

## 2021-08-24 DIAGNOSIS — J309 Allergic rhinitis, unspecified: Secondary | ICD-10-CM | POA: Diagnosis not present

## 2021-08-24 DIAGNOSIS — R5383 Other fatigue: Secondary | ICD-10-CM | POA: Diagnosis not present

## 2021-08-24 DIAGNOSIS — E785 Hyperlipidemia, unspecified: Secondary | ICD-10-CM | POA: Diagnosis not present

## 2021-08-29 ENCOUNTER — Ambulatory Visit (INDEPENDENT_AMBULATORY_CARE_PROVIDER_SITE_OTHER): Payer: BC Managed Care – PPO

## 2021-08-29 DIAGNOSIS — J309 Allergic rhinitis, unspecified: Secondary | ICD-10-CM | POA: Diagnosis not present

## 2021-09-05 ENCOUNTER — Ambulatory Visit (INDEPENDENT_AMBULATORY_CARE_PROVIDER_SITE_OTHER): Payer: BC Managed Care – PPO

## 2021-09-05 DIAGNOSIS — J309 Allergic rhinitis, unspecified: Secondary | ICD-10-CM | POA: Diagnosis not present

## 2021-09-19 ENCOUNTER — Ambulatory Visit (INDEPENDENT_AMBULATORY_CARE_PROVIDER_SITE_OTHER): Payer: BC Managed Care – PPO | Admitting: *Deleted

## 2021-09-19 DIAGNOSIS — J309 Allergic rhinitis, unspecified: Secondary | ICD-10-CM | POA: Diagnosis not present

## 2021-09-25 IMAGING — MG DIGITAL SCREENING BILAT W/ TOMO W/ CAD
8 series · 8 of 24 positions shown · non-contrast
Comparison: None.

CLINICAL DATA: Screening.

EXAM:
DIGITAL SCREENING BILATERAL MAMMOGRAM WITH TOMO AND CAD

[L CC synth-2D]
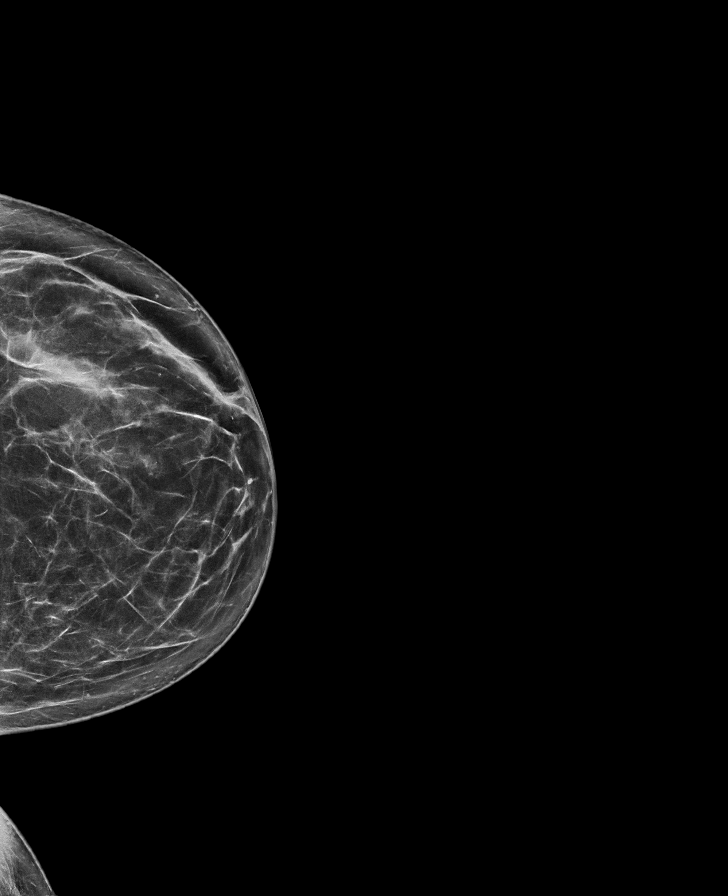

[L MLO synth-2D]
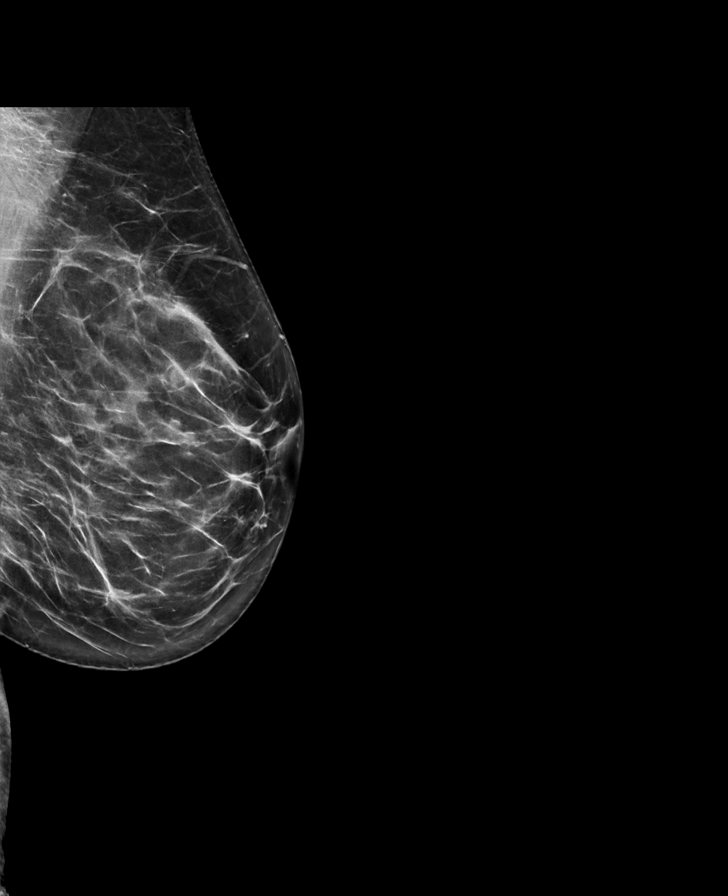

[R MLO synth-2D]
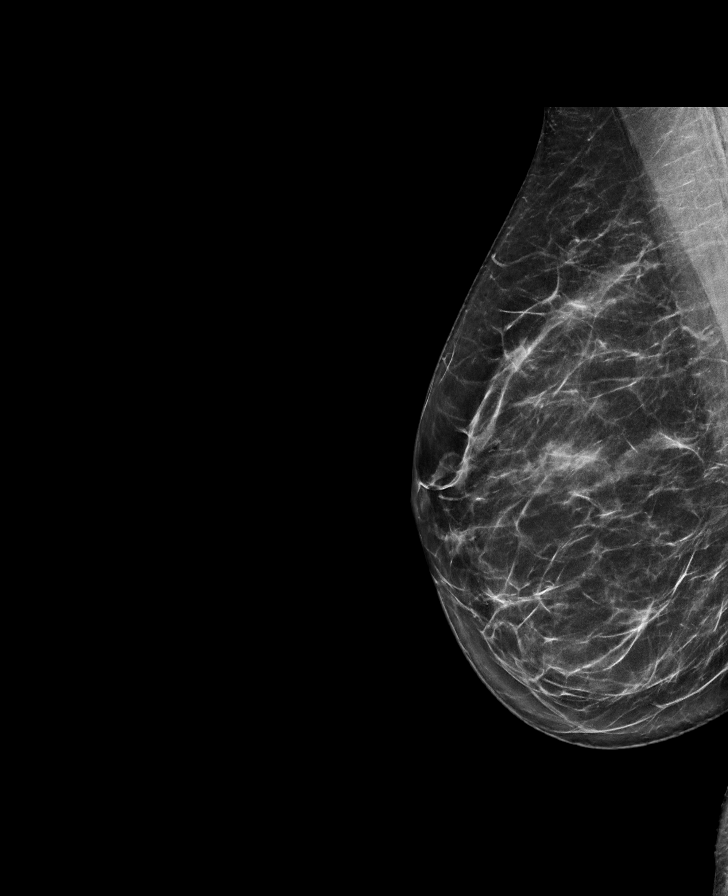

[R CC synth-2D]
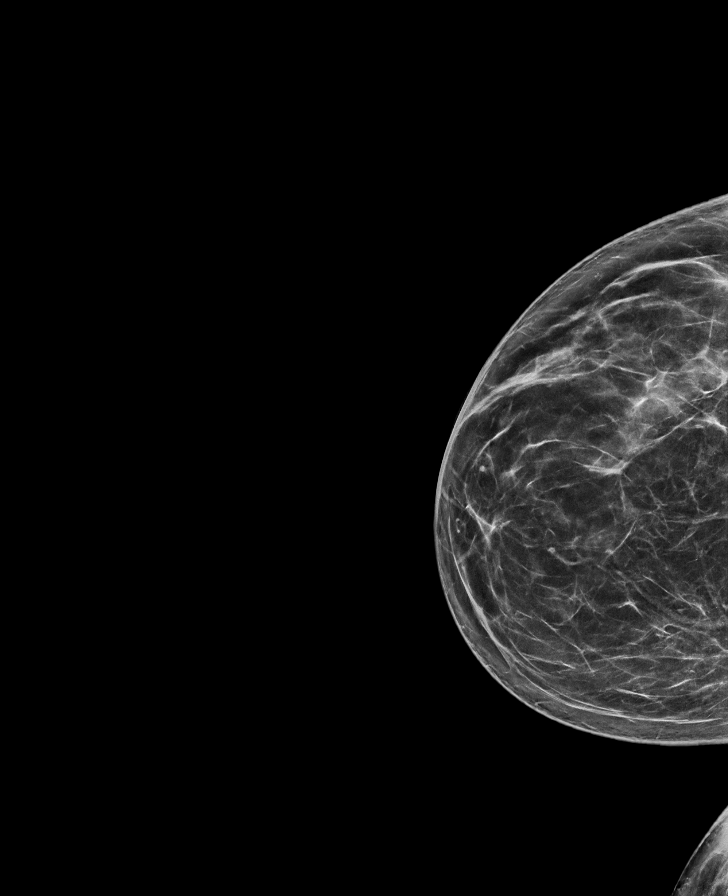

[L CC tomo · tomo slice 37/74.0]
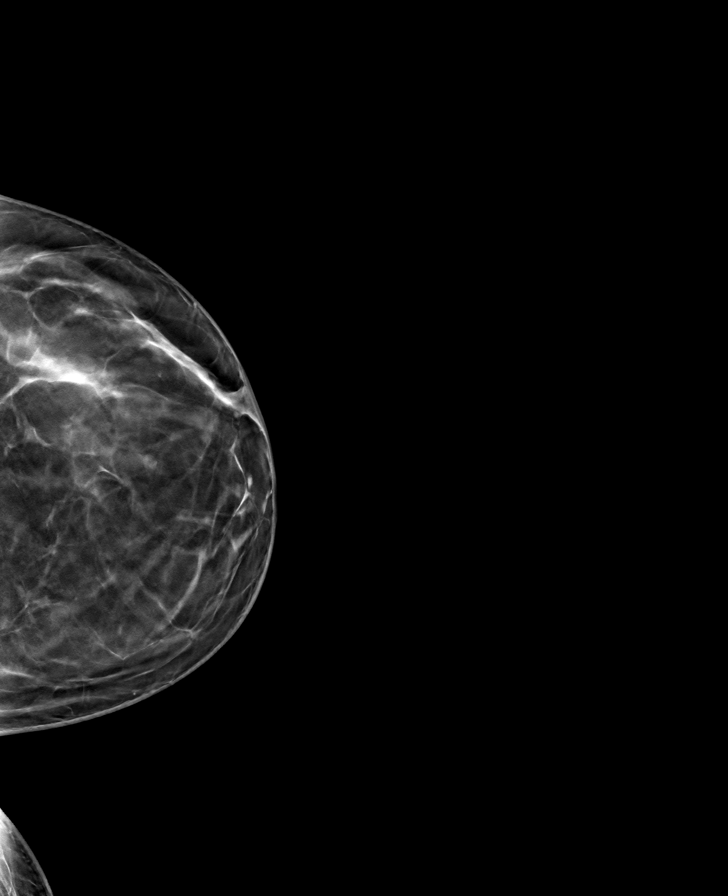

[L MLO tomo · tomo slice 39/77.0]
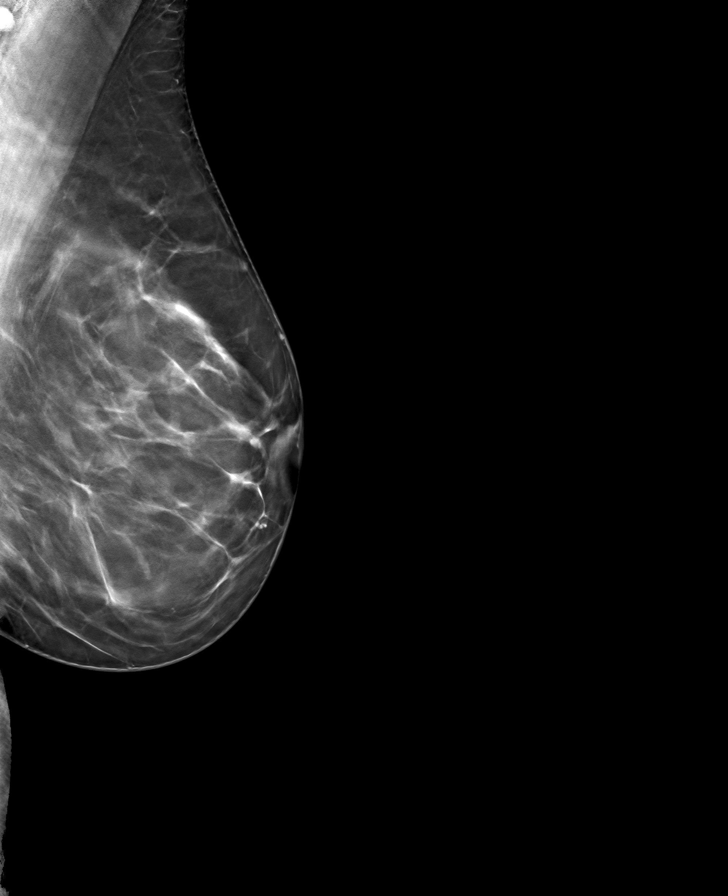

[R CC tomo · tomo slice 39/76.0]
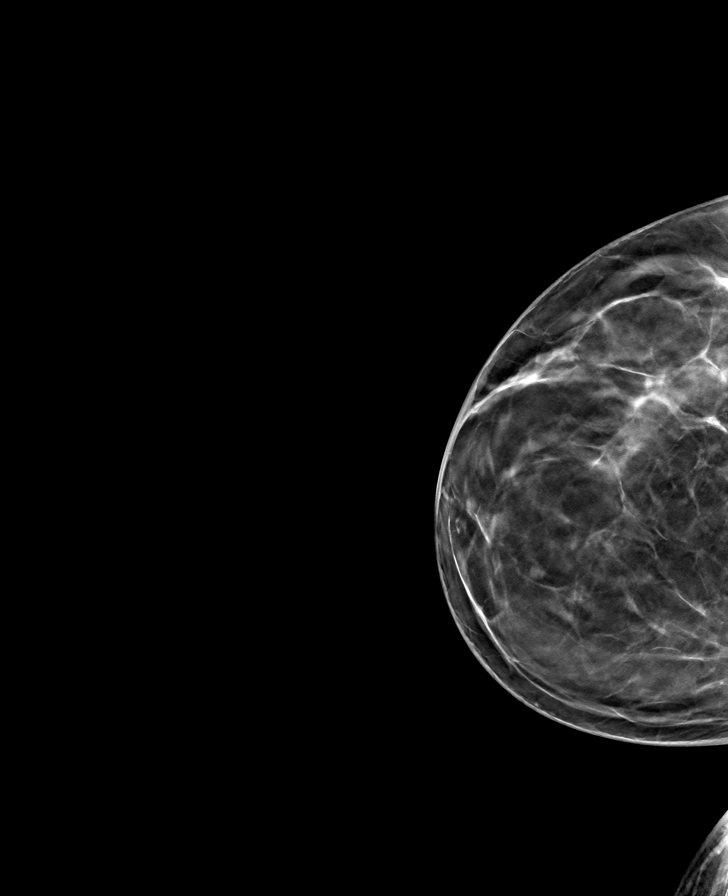

[R MLO tomo · tomo slice 39/77.0]
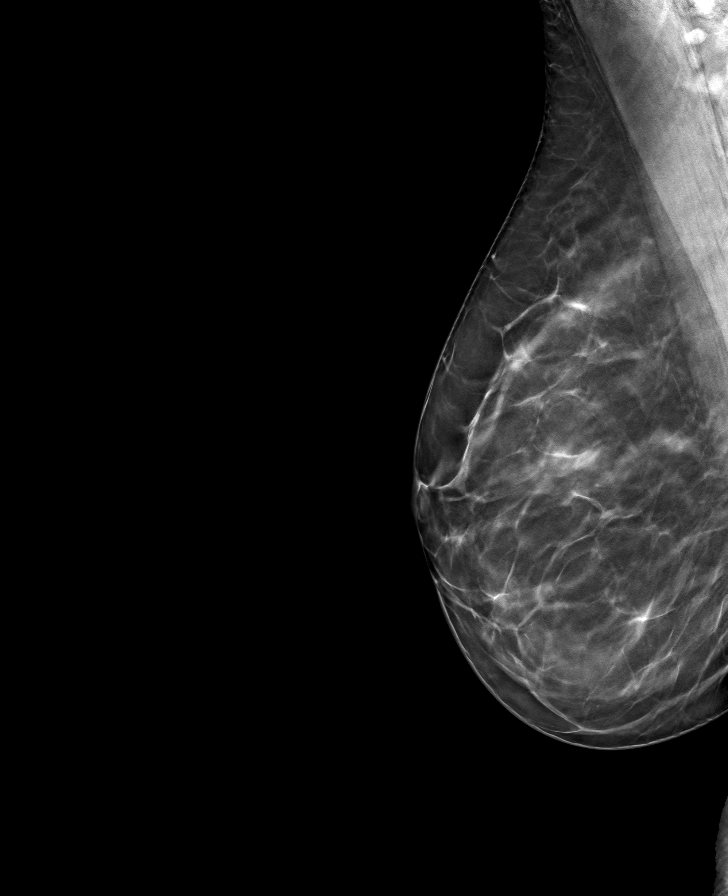

[8 of 24 positions shown; findings below may reference images not displayed]

ACR Breast Density Category b: There are scattered areas of
fibroglandular density.
FINDINGS: In the left breast, a possible mass warrants further evaluation. In
the right breast, no findings suspicious for malignancy.

Images were processed with CAD.
IMPRESSION: Further evaluation is suggested for possible mass in the left
breast.

RECOMMENDATION:
Diagnostic mammogram and possibly ultrasound of the left breast.
(Code:[id number])

The patient will be contacted regarding the findings, and additional
imaging will be scheduled.

BI-RADS CATEGORY  0: Incomplete. Need additional imaging evaluation
and/or prior mammograms for comparison.

## 2021-10-03 ENCOUNTER — Ambulatory Visit (INDEPENDENT_AMBULATORY_CARE_PROVIDER_SITE_OTHER): Payer: BC Managed Care – PPO

## 2021-10-03 DIAGNOSIS — J309 Allergic rhinitis, unspecified: Secondary | ICD-10-CM | POA: Diagnosis not present

## 2021-10-10 DIAGNOSIS — J3089 Other allergic rhinitis: Secondary | ICD-10-CM | POA: Diagnosis not present

## 2021-10-10 NOTE — Progress Notes (Signed)
VIALS EXP 10-11-22 

## 2021-10-12 ENCOUNTER — Ambulatory Visit (INDEPENDENT_AMBULATORY_CARE_PROVIDER_SITE_OTHER): Payer: BC Managed Care – PPO | Admitting: *Deleted

## 2021-10-12 DIAGNOSIS — J309 Allergic rhinitis, unspecified: Secondary | ICD-10-CM

## 2021-10-13 ENCOUNTER — Telehealth: Payer: Self-pay

## 2021-10-13 NOTE — Telephone Encounter (Signed)
Per Geralyn Flash pt needs a follow up lm for pt to call us back about getting this scheduled.

## 2021-10-26 ENCOUNTER — Ambulatory Visit (INDEPENDENT_AMBULATORY_CARE_PROVIDER_SITE_OTHER): Payer: BC Managed Care – PPO

## 2021-10-26 DIAGNOSIS — J309 Allergic rhinitis, unspecified: Secondary | ICD-10-CM

## 2021-11-02 ENCOUNTER — Ambulatory Visit (INDEPENDENT_AMBULATORY_CARE_PROVIDER_SITE_OTHER): Payer: BC Managed Care – PPO

## 2021-11-02 DIAGNOSIS — J309 Allergic rhinitis, unspecified: Secondary | ICD-10-CM

## 2021-11-07 ENCOUNTER — Ambulatory Visit (INDEPENDENT_AMBULATORY_CARE_PROVIDER_SITE_OTHER): Payer: BC Managed Care – PPO

## 2021-11-07 DIAGNOSIS — J309 Allergic rhinitis, unspecified: Secondary | ICD-10-CM | POA: Diagnosis not present

## 2021-11-16 ENCOUNTER — Ambulatory Visit (INDEPENDENT_AMBULATORY_CARE_PROVIDER_SITE_OTHER): Payer: BC Managed Care – PPO

## 2021-11-16 DIAGNOSIS — J309 Allergic rhinitis, unspecified: Secondary | ICD-10-CM

## 2021-11-28 ENCOUNTER — Ambulatory Visit (INDEPENDENT_AMBULATORY_CARE_PROVIDER_SITE_OTHER): Payer: BC Managed Care – PPO

## 2021-11-28 DIAGNOSIS — J309 Allergic rhinitis, unspecified: Secondary | ICD-10-CM | POA: Diagnosis not present

## 2021-11-29 DIAGNOSIS — R5383 Other fatigue: Secondary | ICD-10-CM | POA: Diagnosis not present

## 2021-11-29 DIAGNOSIS — E039 Hypothyroidism, unspecified: Secondary | ICD-10-CM | POA: Diagnosis not present

## 2021-12-05 ENCOUNTER — Ambulatory Visit (INDEPENDENT_AMBULATORY_CARE_PROVIDER_SITE_OTHER): Payer: BC Managed Care – PPO

## 2021-12-05 DIAGNOSIS — J309 Allergic rhinitis, unspecified: Secondary | ICD-10-CM | POA: Diagnosis not present

## 2021-12-19 ENCOUNTER — Ambulatory Visit (INDEPENDENT_AMBULATORY_CARE_PROVIDER_SITE_OTHER): Payer: BC Managed Care – PPO

## 2021-12-19 DIAGNOSIS — J309 Allergic rhinitis, unspecified: Secondary | ICD-10-CM

## 2022-01-02 ENCOUNTER — Ambulatory Visit (INDEPENDENT_AMBULATORY_CARE_PROVIDER_SITE_OTHER): Payer: BC Managed Care – PPO

## 2022-01-02 DIAGNOSIS — J309 Allergic rhinitis, unspecified: Secondary | ICD-10-CM

## 2022-01-23 ENCOUNTER — Ambulatory Visit (INDEPENDENT_AMBULATORY_CARE_PROVIDER_SITE_OTHER): Payer: BC Managed Care – PPO

## 2022-01-23 DIAGNOSIS — J309 Allergic rhinitis, unspecified: Secondary | ICD-10-CM | POA: Diagnosis not present

## 2022-02-13 ENCOUNTER — Ambulatory Visit (INDEPENDENT_AMBULATORY_CARE_PROVIDER_SITE_OTHER): Payer: BC Managed Care – PPO

## 2022-02-13 DIAGNOSIS — J309 Allergic rhinitis, unspecified: Secondary | ICD-10-CM

## 2022-03-06 ENCOUNTER — Ambulatory Visit (INDEPENDENT_AMBULATORY_CARE_PROVIDER_SITE_OTHER): Payer: BC Managed Care – PPO

## 2022-03-06 DIAGNOSIS — J309 Allergic rhinitis, unspecified: Secondary | ICD-10-CM

## 2022-03-12 DIAGNOSIS — J3089 Other allergic rhinitis: Secondary | ICD-10-CM | POA: Diagnosis not present

## 2022-03-12 NOTE — Progress Notes (Signed)
VIALS EXP 03-13-23 

## 2022-03-29 ENCOUNTER — Ambulatory Visit (INDEPENDENT_AMBULATORY_CARE_PROVIDER_SITE_OTHER): Payer: BC Managed Care – PPO

## 2022-03-29 DIAGNOSIS — J309 Allergic rhinitis, unspecified: Secondary | ICD-10-CM

## 2022-04-17 ENCOUNTER — Ambulatory Visit (INDEPENDENT_AMBULATORY_CARE_PROVIDER_SITE_OTHER): Payer: BC Managed Care – PPO

## 2022-04-17 DIAGNOSIS — J309 Allergic rhinitis, unspecified: Secondary | ICD-10-CM | POA: Diagnosis not present

## 2022-04-18 NOTE — Progress Notes (Signed)
Follow Up Note  RE: Sarah Leblanc MRN: [id number] DOB: 06-Nov-1975 Date of Office Visit: 04/19/2022  Referring provider: No ref. provider found Primary care provider: No primary care provider on file.  Chief Complaint: No chief complaint on file.  History of Present Illness: I had the pleasure of seeing Sarah Leblanc for a follow up visit at the [facility] on 04/18/2022. She is a 46 y.o. female, who is being followed for allergic rhinoconjunctivitis on AIT, oral allergy syndrome. Her previous allergy office visit was on 09/29/2020 with Dr. Maudie Mercury. Today is a regular follow up visit.  Seasonal and perennial allergic rhinoconjunctivitis Past history - Rhinoconjunctivitis symptoms for the past 40+ years mainly from the spring through fall. 2021 skin testing showed: Positive to grass, weed, ragweed, trees, mold, dust mites, cat, cockroach. Started AIT on 01/14/2020 (G-W-RW-T & M-DM-C-CR) Interim history - noticing benefit, some localized reactions.  Continue allergy injections. Take Singulair 10mg  at night as needed.  Continue environmental control measures as below. May use over the counter antihistamines such as Zyrtec (cetirizine), Claritin (loratadine), Allegra (fexofenadine), or Xyzal (levocetirizine) daily as needed. May take it twice a day if needed. May use Flonase (fluticasone) nasal spray 1 spray per nostril twice a day as needed for nasal congestion.  Nasal saline spray (i.e., Simply Saline) or nasal saline lavage (i.e., NeilMed) is recommended as needed and prior to medicated nasal sprays.   Pollen-food allergy Past history - Patient avoiding a variety of foods including broccoli, lettuce, leafy greens, certain fresh fruits, gums, tree nuts and carrageenan. Symptoms include GI issues to perioral pruritus. 2021 skin testing showed: Borderline positive to walnuts, almond, hazelnut, pistachio, cabbage. Positive to peanuts. Interim history - no reactions, tried apples with  no issues.  Continue to avoid foods that are bothersome. For mild symptoms you can take over the counter antihistamines such as Benadryl and monitor symptoms closely. If symptoms worsen or if you have severe symptoms including breathing issues, throat closure, significant swelling, whole body hives, severe diarrhea and vomiting, lightheadedness then inject epinephrine and seek immediate medical care afterwards.   Insect sting Past history - Large localized reaction to fire ant bites. 2021 skin testing was negative to fire ant.  If having systemic symptoms in the future then will order fire ant IgE at that time.     Assessment and Plan: [name] is a 46 y.o. female with: No problem-specific Assessment & Plan notes found for this encounter.  No follow-ups on file.  No orders of the defined types were placed in this encounter.  Lab Orders  No laboratory test(s) ordered today    Diagnostics: Spirometry:  Tracings reviewed. Her effort: {Blank single:19197::"Good reproducible efforts.","It was hard to get consistent efforts and there is a question as to whether this reflects a maximal maneuver.","Poor effort, data can not be interpreted."} FVC: ***L FEV1: ***L, ***% predicted FEV1/FVC ratio: ***% Interpretation: {Blank single:19197::"Spirometry consistent with mild obstructive disease","Spirometry consistent with moderate obstructive disease","Spirometry consistent with severe obstructive disease","Spirometry consistent with possible restrictive disease","Spirometry consistent with mixed obstructive and restrictive disease","Spirometry uninterpretable due to technique","Spirometry consistent with normal pattern","No overt abnormalities noted given today's efforts"}.  Please see scanned spirometry results for details.  Skin Testing: {Blank single:19197::"Select foods","Environmental allergy panel","Environmental allergy panel and select foods","Food allergy panel","None","Deferred due to recent  antihistamines use"}. *** Results discussed with patient/family.   Medication List:  Current Outpatient Medications  Medication Sig Dispense Refill  . atorvastatin (LIPITOR) 20 MG tablet Take 20 mg by  mouth daily.  (Patient not taking: Reported on 09/29/2020)    . AUVI-Q 0.3 MG/0.3ML SOAJ injection Inject into the muscle as directed.     . calcium-vitamin D (OSCAL WITH D) 500-200 MG-UNIT tablet Take 1 tablet by mouth.     . cetirizine (ZYRTEC) 10 MG tablet Take 10 mg by mouth daily.     . famotidine (PEPCID) 10 MG tablet Take 10 mg by mouth 2 (two) times daily.     Marland Kitchen levothyroxine (SYNTHROID) 50 MCG tablet Take 50 mcg by mouth daily before breakfast.  (Patient not taking: Reported on 09/29/2020)    . levothyroxine (SYNTHROID) 75 MCG tablet Take 75 mcg by mouth daily.     . montelukast (SINGULAIR) 10 MG tablet Take 1 tablet (10 mg total) by mouth at bedtime. 90 tablet 1  . Multiple Vitamin (MULTIVITAMIN) capsule Take 1 capsule by mouth daily.      No current facility-administered medications for this visit.   Allergies: Allergies  Allergen Reactions  . Other Hives   I reviewed her past medical history, social history, family history, and environmental history and no significant changes have been reported from her previous visit.  Review of Systems  Constitutional:  Negative for appetite change, chills, fever and unexpected weight change.  HENT:  Negative for congestion and rhinorrhea.   Eyes:  Negative for itching.  Respiratory:  Negative for cough, chest tightness, shortness of breath and wheezing.   Cardiovascular:  Negative for chest pain.  Gastrointestinal:  Negative for abdominal pain.  Genitourinary:  Negative for difficulty urinating.  Skin:  Negative for rash.  Allergic/Immunologic: Positive for environmental allergies and food allergies.  Neurological:  Negative for headaches.   Objective: There were no vitals taken for this visit. There is no height or weight on file  to calculate BMI. Physical Exam Vitals and nursing note reviewed.  Constitutional:      Appearance: Normal appearance. She is well-developed.  HENT:     Head: Normocephalic and atraumatic.     Right Ear: Tympanic membrane and external ear normal.     Left Ear: Tympanic membrane and external ear normal.     Nose: Nose normal. No rhinorrhea.     Mouth/Throat:     Mouth: Mucous membranes are moist.     Pharynx: Oropharynx is clear.  Eyes:     Conjunctiva/sclera: Conjunctivae normal.  Cardiovascular:     Rate and Rhythm: Normal rate and regular rhythm.     Heart sounds: Normal heart sounds. No murmur heard.    No friction rub. No gallop.  Pulmonary:     Effort: Pulmonary effort is normal.     Breath sounds: Normal breath sounds. No wheezing, rhonchi or rales.  Musculoskeletal:     Cervical back: Neck supple.  Skin:    General: Skin is warm.     Findings: No rash.  Neurological:     Mental Status: She is alert and oriented to person, place, and time.  Psychiatric:        Behavior: Behavior normal.  Previous notes and tests were reviewed. The plan was reviewed with the patient/family, and all questions/concerned were addressed.  It was my pleasure to see Sarah Leblanc today and participate in her care. Please feel free to contact me with any questions or concerns.  Sincerely,  Wyline Mood, DO Allergy & Immunology  [facility] office: [phone] [facility] office: [phone]

## 2022-04-19 ENCOUNTER — Ambulatory Visit (INDEPENDENT_AMBULATORY_CARE_PROVIDER_SITE_OTHER): Payer: BC Managed Care – PPO | Admitting: Allergy

## 2022-04-19 ENCOUNTER — Encounter: Payer: Self-pay | Admitting: Allergy

## 2022-04-19 VITALS — BP 98/66 | HR 78 | Temp 97.9°F | Resp 16

## 2022-04-19 DIAGNOSIS — Z23 Encounter for immunization: Secondary | ICD-10-CM

## 2022-04-19 DIAGNOSIS — H1013 Acute atopic conjunctivitis, bilateral: Secondary | ICD-10-CM

## 2022-04-19 DIAGNOSIS — J302 Other seasonal allergic rhinitis: Secondary | ICD-10-CM | POA: Diagnosis not present

## 2022-04-19 DIAGNOSIS — T781XXD Other adverse food reactions, not elsewhere classified, subsequent encounter: Secondary | ICD-10-CM | POA: Diagnosis not present

## 2022-04-19 DIAGNOSIS — H1010 Acute atopic conjunctivitis, unspecified eye: Secondary | ICD-10-CM

## 2022-04-19 DIAGNOSIS — T63481D Toxic effect of venom of other arthropod, accidental (unintentional), subsequent encounter: Secondary | ICD-10-CM

## 2022-04-19 MED ORDER — EPINEPHRINE 0.3 MG/0.3ML IJ SOAJ: 0.3000 mg | INTRAMUSCULAR | 1 refills | Status: DC | PRN

## 2022-04-19 NOTE — Patient Instructions (Addendum)
Flu shot given today - VIS given.   Environmental allergies 2021 skin testing positive to grass, weed, ragweed, trees, mold, dust mites, cat, cockroach.  Continue allergy injections. Continue environmental control measures as below. May use over the counter antihistamines such as Zyrtec (cetirizine), Claritin (loratadine), Allegra (fexofenadine), or Xyzal (levocetirizine) daily as needed. May take it twice a day if needed. May use Flonase (fluticasone) nasal spray 1 spray per nostril twice a day as needed for nasal congestion OR  Use Nasacort (triamcinolone) nasal spray 1 spray per nostril twice a day as needed for nasal congestion.  Nasal saline spray (i.e., Simply Saline) or nasal saline lavage (i.e., NeilMed) is recommended as needed and prior to medicated nasal sprays.  Food:  Continue to avoid foods that are bothersome. For mild symptoms you can take over the counter antihistamines such as Benadryl and monitor symptoms closely. If symptoms worsen or if you have severe symptoms including breathing issues, throat closure, significant swelling, whole body hives, severe diarrhea and vomiting, lightheadedness then inject epinephrine and seek immediate medical care afterwards.  Insect stings: If you notice any worsening symptoms. Let us know and we can order the bloodwork.  Follow up in 12 months or sooner if needed.   Reducing Pollen Exposure Pollen seasons: trees (spring), grass (summer) and ragweed/weeds (fall). Keep windows closed in your home and car to lower pollen exposure.  Install air conditioning in the bedroom and throughout the house if possible.  Avoid going out in dry windy days - especially early morning. Pollen counts are highest between 5 - 10 AM and on dry, hot and windy days.  Save outside activities for late afternoon or after a heavy rain, when pollen levels are lower.  Avoid mowing of grass if you have grass pollen allergy. Be aware that pollen can also be  transported indoors on people and pets.  Dry your clothes in an automatic dryer rather than hanging them outside where they might collect pollen.  Rinse hair and eyes before bedtime. Mold Control Mold and fungi can grow on a variety of surfaces provided certain temperature and moisture conditions exist.  Outdoor molds grow on plants, decaying vegetation and soil. The major outdoor mold, Alternaria and Cladosporium, are found in very high numbers during hot and dry conditions. Generally, a late summer - fall peak is seen for common outdoor fungal spores. Rain will temporarily lower outdoor mold spore count, but counts rise rapidly when the rainy period ends. The most important indoor molds are Aspergillus and Penicillium. Dark, humid and poorly ventilated basements are ideal sites for mold growth. The next most common sites of mold growth are the bathroom and the kitchen. Outdoor (Seasonal) Mold Control Use air conditioning and keep windows closed. Avoid exposure to decaying vegetation. Avoid leaf raking. Avoid grain handling. Consider wearing a face mask if working in moldy areas.  Indoor (Perennial) Mold Control  Maintain humidity below 50%. Get rid of mold growth on hard surfaces with water, detergent and, if necessary, 5% bleach (do not mix with other cleaners). Then dry the area completely. If mold covers an area more than 10 square feet, consider hiring an indoor environmental professional. For clothing, washing with soap and water is best. If moldy items cannot be cleaned and dried, throw them away. Remove sources e.g. contaminated carpets. Repair and seal leaking roofs or pipes. Using dehumidifiers in damp basements may be helpful, but empty the water and clean units regularly to prevent mildew from forming. All rooms, especially basements,  bathrooms and kitchens, require ventilation and cleaning to deter mold and mildew growth. Avoid carpeting on concrete or damp floors, and storing items  in damp areas. Control of House Dust Mite Allergen Dust mite allergens are a common trigger of allergy and asthma symptoms. While they can be found throughout the house, these microscopic creatures thrive in warm, humid environments such as bedding, upholstered furniture and carpeting. Because so much time is spent in the bedroom, it is essential to reduce mite levels there.  Encase pillows, mattresses, and box springs in special allergen-proof fabric covers or airtight, zippered plastic covers.  Bedding should be washed weekly in hot water (130 F) and dried in a hot dryer. Allergen-proof covers are available for comforters and pillows that can't be regularly washed.  Wash the allergy-proof covers every few months. Minimize clutter in the bedroom. Keep pets out of the bedroom.  Keep humidity less than 50% by using a dehumidifier or air conditioning. You can buy a humidity measuring device called a hygrometer to monitor this.  If possible, replace carpets with hardwood, linoleum, or washable area rugs. If that's not possible, vacuum frequently with a vacuum that has a HEPA filter. Remove all upholstered furniture and non-washable window drapes from the bedroom. Remove all non-washable stuffed toys from the bedroom.  Wash stuffed toys weekly. Pet Allergen Avoidance: Contrary to popular opinion, there are no "hypoallergenic" breeds of dogs or cats. That is because people are not allergic to an animal's hair, but to an allergen found in the animal's saliva, dander (dead skin flakes) or urine. Pet allergy symptoms typically occur within minutes. For some people, symptoms can build up and become most severe 8 to 12 hours after contact with the animal. People with severe allergies can experience reactions in public places if dander has been transported on the pet owners' clothing. Keeping an animal outdoors is only a partial solution, since homes with pets in the yard still have higher concentrations of  animal allergens. Before getting a pet, ask your allergist to determine if you are allergic to animals. If your pet is already considered part of your family, try to minimize contact and keep the pet out of the bedroom and other rooms where you spend a great deal of time. As with dust mites, vacuum carpets often or replace carpet with a hardwood floor, tile or linoleum. High-efficiency particulate air (HEPA) cleaners can reduce allergen levels over time. While dander and saliva are the source of cat and dog allergens, urine is the source of allergens from rabbits, hamsters, mice and [place] pigs; so ask a non-allergic family member to clean the animal's cage. If you have a pet allergy, talk to your allergist about the potential for allergy immunotherapy (allergy shots). This strategy can often provide long-term relief.

## 2022-04-19 NOTE — Assessment & Plan Note (Signed)
Past history - Rhinoconjunctivitis symptoms for the past 40+ years mainly from the spring through fall. 2021 skin testing showed: Positive to grass, weed, ragweed, trees, mold, dust mites, cat, cockroach. Started AIT on 01/14/2020 (G-W-RW-T & M-DM-C-CR). Interim history - some localized reactions with pollen shots. Singulair caused mood changes.  Continue allergy injections. Continue environmental control measures as below. May use over the counter antihistamines such as Zyrtec (cetirizine), Claritin (loratadine), Allegra (fexofenadine), or Xyzal (levocetirizine) daily as needed. May take it twice a day if needed. May use Flonase (fluticasone) nasal spray 1 spray per nostril twice a day as needed for nasal congestion OR  Use Nasacort (triamcinolone) nasal spray 1 spray per nostril twice a day as needed for nasal congestion. Sample given. Nasal saline spray (i.e., Simply Saline) or nasal saline lavage (i.e., NeilMed) is recommended as needed and prior to medicated nasal sprays.

## 2022-04-19 NOTE — Assessment & Plan Note (Signed)
Past history - Large localized reaction to fire ant bites. 2021 skin testing was negative to fire ant.  If having systemic symptoms in the future then will order fire ant IgE at that time.

## 2022-04-19 NOTE — Assessment & Plan Note (Signed)
Past history - Patient avoiding a variety of foods including broccoli, lettuce, leafy greens, certain fresh fruits, gums, tree nuts and carrageenan. Symptoms include GI issues to perioral pruritus. 2021 skin testing showed: Borderline positive to walnuts, almond, hazelnut, pistachio, cabbage. Positive to peanuts. Interim history - tolerates fresh fruits except for bananas now.   Continue to avoid foods that are bothersome. For mild symptoms you can take over the counter antihistamines such as Benadryl and monitor symptoms closely. If symptoms worsen or if you have severe symptoms including breathing issues, throat closure, significant swelling, whole body hives, severe diarrhea and vomiting, lightheadedness then inject epinephrine and seek immediate medical care afterwards.

## 2022-04-20 DIAGNOSIS — M7741 Metatarsalgia, right foot: Secondary | ICD-10-CM | POA: Diagnosis not present

## 2022-04-20 DIAGNOSIS — M2041 Other hammer toe(s) (acquired), right foot: Secondary | ICD-10-CM | POA: Diagnosis not present

## 2022-04-20 DIAGNOSIS — L851 Acquired keratosis [keratoderma] palmaris et plantaris: Secondary | ICD-10-CM | POA: Diagnosis not present

## 2022-04-20 DIAGNOSIS — M79671 Pain in right foot: Secondary | ICD-10-CM | POA: Diagnosis not present

## 2022-04-24 ENCOUNTER — Ambulatory Visit (INDEPENDENT_AMBULATORY_CARE_PROVIDER_SITE_OTHER): Payer: BC Managed Care – PPO

## 2022-04-24 DIAGNOSIS — J309 Allergic rhinitis, unspecified: Secondary | ICD-10-CM | POA: Diagnosis not present

## 2022-05-01 ENCOUNTER — Ambulatory Visit (INDEPENDENT_AMBULATORY_CARE_PROVIDER_SITE_OTHER): Payer: BC Managed Care – PPO

## 2022-05-01 DIAGNOSIS — J309 Allergic rhinitis, unspecified: Secondary | ICD-10-CM

## 2022-05-10 ENCOUNTER — Ambulatory Visit (INDEPENDENT_AMBULATORY_CARE_PROVIDER_SITE_OTHER): Payer: BC Managed Care – PPO

## 2022-05-10 DIAGNOSIS — J309 Allergic rhinitis, unspecified: Secondary | ICD-10-CM | POA: Diagnosis not present

## 2022-05-15 ENCOUNTER — Ambulatory Visit (INDEPENDENT_AMBULATORY_CARE_PROVIDER_SITE_OTHER): Payer: BC Managed Care – PPO | Admitting: *Deleted

## 2022-05-15 DIAGNOSIS — J309 Allergic rhinitis, unspecified: Secondary | ICD-10-CM

## 2022-05-29 ENCOUNTER — Ambulatory Visit (INDEPENDENT_AMBULATORY_CARE_PROVIDER_SITE_OTHER): Payer: BC Managed Care – PPO

## 2022-05-29 DIAGNOSIS — J309 Allergic rhinitis, unspecified: Secondary | ICD-10-CM | POA: Diagnosis not present

## 2022-06-12 ENCOUNTER — Ambulatory Visit (INDEPENDENT_AMBULATORY_CARE_PROVIDER_SITE_OTHER): Payer: BC Managed Care – PPO

## 2022-06-12 DIAGNOSIS — J309 Allergic rhinitis, unspecified: Secondary | ICD-10-CM | POA: Diagnosis not present

## 2022-06-21 ENCOUNTER — Ambulatory Visit (INDEPENDENT_AMBULATORY_CARE_PROVIDER_SITE_OTHER): Payer: BC Managed Care – PPO

## 2022-06-21 DIAGNOSIS — J309 Allergic rhinitis, unspecified: Secondary | ICD-10-CM | POA: Diagnosis not present

## 2022-07-03 ENCOUNTER — Ambulatory Visit (INDEPENDENT_AMBULATORY_CARE_PROVIDER_SITE_OTHER): Payer: BC Managed Care – PPO

## 2022-07-03 DIAGNOSIS — J309 Allergic rhinitis, unspecified: Secondary | ICD-10-CM | POA: Diagnosis not present

## 2022-07-24 ENCOUNTER — Ambulatory Visit (INDEPENDENT_AMBULATORY_CARE_PROVIDER_SITE_OTHER): Payer: BC Managed Care – PPO

## 2022-07-24 DIAGNOSIS — J309 Allergic rhinitis, unspecified: Secondary | ICD-10-CM

## 2022-08-02 NOTE — Progress Notes (Signed)
VIALS EXP 08-02-23

## 2022-08-03 DIAGNOSIS — J3089 Other allergic rhinitis: Secondary | ICD-10-CM | POA: Diagnosis not present

## 2022-08-14 ENCOUNTER — Telehealth: Payer: Self-pay

## 2022-08-14 NOTE — Telephone Encounter (Signed)
Left a message for patient informing her our [facility] office will be closed 08/16/22  Maysie [phone]

## 2022-08-21 ENCOUNTER — Ambulatory Visit (INDEPENDENT_AMBULATORY_CARE_PROVIDER_SITE_OTHER): Payer: BC Managed Care – PPO

## 2022-08-21 DIAGNOSIS — E785 Hyperlipidemia, unspecified: Secondary | ICD-10-CM | POA: Diagnosis not present

## 2022-08-21 DIAGNOSIS — E559 Vitamin D deficiency, unspecified: Secondary | ICD-10-CM | POA: Diagnosis not present

## 2022-08-21 DIAGNOSIS — R5383 Other fatigue: Secondary | ICD-10-CM | POA: Diagnosis not present

## 2022-08-21 DIAGNOSIS — J309 Allergic rhinitis, unspecified: Secondary | ICD-10-CM | POA: Diagnosis not present

## 2022-08-21 DIAGNOSIS — E039 Hypothyroidism, unspecified: Secondary | ICD-10-CM | POA: Diagnosis not present

## 2022-09-11 ENCOUNTER — Ambulatory Visit (INDEPENDENT_AMBULATORY_CARE_PROVIDER_SITE_OTHER): Payer: BC Managed Care – PPO

## 2022-09-11 DIAGNOSIS — J309 Allergic rhinitis, unspecified: Secondary | ICD-10-CM | POA: Diagnosis not present

## 2022-09-18 ENCOUNTER — Ambulatory Visit (INDEPENDENT_AMBULATORY_CARE_PROVIDER_SITE_OTHER): Payer: BC Managed Care – PPO

## 2022-09-18 DIAGNOSIS — J309 Allergic rhinitis, unspecified: Secondary | ICD-10-CM

## 2022-09-25 ENCOUNTER — Ambulatory Visit (INDEPENDENT_AMBULATORY_CARE_PROVIDER_SITE_OTHER): Payer: BC Managed Care – PPO

## 2022-09-25 DIAGNOSIS — J309 Allergic rhinitis, unspecified: Secondary | ICD-10-CM | POA: Diagnosis not present

## 2022-10-04 ENCOUNTER — Ambulatory Visit (INDEPENDENT_AMBULATORY_CARE_PROVIDER_SITE_OTHER): Payer: BC Managed Care – PPO

## 2022-10-04 DIAGNOSIS — J309 Allergic rhinitis, unspecified: Secondary | ICD-10-CM | POA: Diagnosis not present

## 2022-10-23 ENCOUNTER — Ambulatory Visit (INDEPENDENT_AMBULATORY_CARE_PROVIDER_SITE_OTHER): Payer: BC Managed Care – PPO

## 2022-10-23 DIAGNOSIS — J309 Allergic rhinitis, unspecified: Secondary | ICD-10-CM | POA: Diagnosis not present

## 2022-10-30 ENCOUNTER — Ambulatory Visit (INDEPENDENT_AMBULATORY_CARE_PROVIDER_SITE_OTHER): Payer: BC Managed Care – PPO

## 2022-10-30 DIAGNOSIS — J309 Allergic rhinitis, unspecified: Secondary | ICD-10-CM | POA: Diagnosis not present

## 2022-11-22 ENCOUNTER — Ambulatory Visit (INDEPENDENT_AMBULATORY_CARE_PROVIDER_SITE_OTHER): Payer: BC Managed Care – PPO

## 2022-11-22 DIAGNOSIS — J309 Allergic rhinitis, unspecified: Secondary | ICD-10-CM | POA: Diagnosis not present

## 2022-12-13 ENCOUNTER — Ambulatory Visit (INDEPENDENT_AMBULATORY_CARE_PROVIDER_SITE_OTHER): Payer: BC Managed Care – PPO

## 2022-12-13 DIAGNOSIS — J309 Allergic rhinitis, unspecified: Secondary | ICD-10-CM

## 2023-01-02 DIAGNOSIS — K635 Polyp of colon: Secondary | ICD-10-CM | POA: Diagnosis not present

## 2023-01-02 DIAGNOSIS — K5730 Diverticulosis of large intestine without perforation or abscess without bleeding: Secondary | ICD-10-CM | POA: Diagnosis not present

## 2023-01-02 DIAGNOSIS — Z1211 Encounter for screening for malignant neoplasm of colon: Secondary | ICD-10-CM | POA: Diagnosis not present

## 2023-01-03 ENCOUNTER — Ambulatory Visit (INDEPENDENT_AMBULATORY_CARE_PROVIDER_SITE_OTHER): Payer: BC Managed Care – PPO

## 2023-01-03 DIAGNOSIS — J309 Allergic rhinitis, unspecified: Secondary | ICD-10-CM

## 2023-01-15 DIAGNOSIS — J3089 Other allergic rhinitis: Secondary | ICD-10-CM | POA: Diagnosis not present

## 2023-01-15 NOTE — Progress Notes (Signed)
VIALS EXP 01-15-24

## 2023-01-22 ENCOUNTER — Ambulatory Visit (INDEPENDENT_AMBULATORY_CARE_PROVIDER_SITE_OTHER): Payer: BC Managed Care – PPO

## 2023-01-22 DIAGNOSIS — J309 Allergic rhinitis, unspecified: Secondary | ICD-10-CM | POA: Diagnosis not present

## 2023-02-12 ENCOUNTER — Ambulatory Visit (INDEPENDENT_AMBULATORY_CARE_PROVIDER_SITE_OTHER): Payer: BC Managed Care – PPO

## 2023-02-12 DIAGNOSIS — J309 Allergic rhinitis, unspecified: Secondary | ICD-10-CM | POA: Diagnosis not present

## 2023-03-05 ENCOUNTER — Ambulatory Visit (INDEPENDENT_AMBULATORY_CARE_PROVIDER_SITE_OTHER): Payer: BC Managed Care – PPO

## 2023-03-05 DIAGNOSIS — J309 Allergic rhinitis, unspecified: Secondary | ICD-10-CM

## 2023-03-14 ENCOUNTER — Ambulatory Visit (INDEPENDENT_AMBULATORY_CARE_PROVIDER_SITE_OTHER): Payer: BC Managed Care – PPO

## 2023-03-14 DIAGNOSIS — J309 Allergic rhinitis, unspecified: Secondary | ICD-10-CM | POA: Diagnosis not present

## 2023-03-19 ENCOUNTER — Ambulatory Visit (INDEPENDENT_AMBULATORY_CARE_PROVIDER_SITE_OTHER): Payer: BC Managed Care – PPO

## 2023-03-19 DIAGNOSIS — J309 Allergic rhinitis, unspecified: Secondary | ICD-10-CM

## 2023-03-26 ENCOUNTER — Ambulatory Visit (INDEPENDENT_AMBULATORY_CARE_PROVIDER_SITE_OTHER): Payer: BC Managed Care – PPO

## 2023-03-26 DIAGNOSIS — J309 Allergic rhinitis, unspecified: Secondary | ICD-10-CM | POA: Diagnosis not present

## 2023-04-02 ENCOUNTER — Ambulatory Visit (INDEPENDENT_AMBULATORY_CARE_PROVIDER_SITE_OTHER): Payer: BC Managed Care – PPO

## 2023-04-02 DIAGNOSIS — J309 Allergic rhinitis, unspecified: Secondary | ICD-10-CM | POA: Diagnosis not present

## 2023-04-09 ENCOUNTER — Ambulatory Visit (INDEPENDENT_AMBULATORY_CARE_PROVIDER_SITE_OTHER): Payer: BC Managed Care – PPO

## 2023-04-09 DIAGNOSIS — J309 Allergic rhinitis, unspecified: Secondary | ICD-10-CM

## 2023-04-23 ENCOUNTER — Ambulatory Visit (INDEPENDENT_AMBULATORY_CARE_PROVIDER_SITE_OTHER): Payer: BC Managed Care – PPO | Admitting: Allergy

## 2023-04-23 ENCOUNTER — Encounter: Payer: Self-pay | Admitting: Allergy

## 2023-04-23 VITALS — BP 106/70 | HR 79 | Temp 98.2°F | Resp 18 | Ht 61.61 in | Wt 135.0 lb

## 2023-04-23 DIAGNOSIS — H1013 Acute atopic conjunctivitis, bilateral: Secondary | ICD-10-CM

## 2023-04-23 DIAGNOSIS — J3081 Allergic rhinitis due to animal (cat) (dog) hair and dander: Secondary | ICD-10-CM

## 2023-04-23 DIAGNOSIS — J3089 Other allergic rhinitis: Secondary | ICD-10-CM

## 2023-04-23 DIAGNOSIS — J301 Allergic rhinitis due to pollen: Secondary | ICD-10-CM

## 2023-04-23 DIAGNOSIS — Z91038 Other insect allergy status: Secondary | ICD-10-CM

## 2023-04-23 DIAGNOSIS — T781XXD Other adverse food reactions, not elsewhere classified, subsequent encounter: Secondary | ICD-10-CM

## 2023-04-23 DIAGNOSIS — T63481A Toxic effect of venom of other arthropod, accidental (unintentional), initial encounter: Secondary | ICD-10-CM

## 2023-04-23 MED ORDER — EPINEPHRINE 0.3 MG/0.3ML IJ SOAJ: 0.3000 mg | INTRAMUSCULAR | 1 refills | Status: DC | PRN

## 2023-04-23 NOTE — Progress Notes (Signed)
Follow Up Note  RE: Sarah Leblanc MRN: [id number] DOB: 1975-09-08 Date of Office Visit: 04/23/2023  Referring provider: Sigmund Hazel, MD Primary care provider: Sigmund Hazel, MD  Chief Complaint: Follow-up (Does not have a very good taste or smell despite being on AIT. )  History of Present Illness: I had the pleasure of seeing Sarah Leblanc for a follow up visit at the [facility] on 04/23/2023. She is a 47 y.o. female, who is being followed for allergic rhinoconjunctivitis on AIT, oral allergy syndrome, large localized reaction to insect stings. Her previous allergy office visit was on 04/19/2022 with Dr. Selena Batten. Today is a regular follow up visit.  Discussed the use of AI scribe software for clinical note transcription with the patient, who gave verbal consent to proceed.  The patient, currently on allergy shots, reports significant improvement in her symptoms, describing this as the best fall she has had. She has experienced no adverse reactions to the shots, with only minor localized reactions at the injection site, particularly when starting a new vial. The patient's shot schedule has been adjusted, and she is considering extending the interval between shots to every four weeks, provided her allergy symptoms do not return.  The patient continues to take daily allergy medication, specifically Claritin, and uses Flonase as needed, particularly when she anticipates exposure to allergens. She has noticed a significant improvement in her oral allergy syndrome, now able to consume fresh fruits and vegetables without any adverse reactions. However, she has not yet tried certain foods that previously caused reactions, such as broccoli and cantaloupe. She has also avoided certain nuts due to previous reactions.  The patient reports a loss of taste and smell, which she attributes to a previous COVID-19 infection. She can taste certain things, but cannot smell roses or perfume. She has  noticed that she can smell unpleasant odors, such as bad breath     Assessment and Plan: Sarah Leblanc is a 47 y.o. female with: Seasonal allergic rhinitis due to pollen Allergic rhinitis due to animal dander Allergic rhinitis due to dust mite Allergic rhinitis due to mold Allergy to cockroaches Allergic conjunctivitis of both eyes Past history - Rhinoconjunctivitis symptoms for the past 40+ years mainly from the spring through fall. 2021 skin testing positive to grass, weed, ragweed, trees, mold, dust mites, cat, cockroach. Started AIT on 01/14/2020 (G-W-RW-T & M-DM-C-CR). Interim history - Improved symptoms with allergy shots. Mild localized reaction to new vial. Currently on Claritin daily and Flonase as needed. Continue allergy injections. Will build up new vials faster (0.1, 0.3, 0.5) and go to every 4 weeks as maintenance dose.  Continue environmental control measures.  May use over the counter antihistamines such as Zyrtec (cetirizine), Claritin (loratadine), Allegra (fexofenadine), or Xyzal (levocetirizine) daily as needed. May take it twice a day if needed. Only take as needed now. Make sure you take on the days of injections.  Use Flonase (fluticasone) nasal spray 1-2 sprays per nostril once a day as needed for nasal congestion.  Nasal saline spray (i.e., Simply Saline) or nasal saline lavage (i.e., NeilMed) is recommended as needed and prior to medicated nasal sprays.  Oral allergy syndrome, subsequent encounter Past history - Patient avoiding a variety of foods including broccoli, lettuce, leafy greens, certain fresh fruits, gums, tree nuts and carrageenan. Symptoms include GI issues to perioral pruritus. 2021 skin testing showed: Borderline positive to walnuts, almond, hazelnut, pistachio, cabbage. Positive to peanuts. Interim history - Improvement in tolerance to fresh fruits  and vegetables. Continue to avoid items that still bother you. For mild symptoms you can take over the counter  antihistamines such as Benadryl 1-2 tablets = 25-50mg  and monitor symptoms closely. If symptoms worsen or if you have severe symptoms including breathing issues, throat closure, significant swelling, whole body hives, severe diarrhea and vomiting, lightheadedness then inject epinephrine and seek immediate medical care afterwards. Emergency action plan in place.   Local reaction to hymenoptera sting Past history - Large localized reaction to fire ant bites. 2021 skin testing was negative to fire ant.  If having systemic symptoms in the future then will order fire ant IgE at that time.   Return in about 1 year (around 04/22/2024).  Meds ordered this encounter  Medications   EPINEPHrine (AUVI-Q) 0.3 mg/0.3 mL IJ SOAJ injection    Sig: Inject 0.3 mg into the muscle as needed for anaphylaxis.    Dispense:  2 each    Refill:  1    [phone]   Lab Orders  No laboratory test(s) ordered today    Diagnostics: None.   Medication List:  Current Outpatient Medications  Medication Sig Dispense Refill   calcium-vitamin D (OSCAL WITH D) 500-200 MG-UNIT tablet Take 1 tablet by mouth.      cetirizine (ZYRTEC) 10 MG tablet Take 10 mg by mouth daily.      EPINEPHrine (AUVI-Q) 0.3 mg/0.3 mL IJ SOAJ injection Inject 0.3 mg into the muscle as needed for anaphylaxis. 2 each 1   famotidine (PEPCID) 10 MG tablet Take 20 mg by mouth daily.     levothyroxine (SYNTHROID) 88 MCG tablet Take 88 mcg by mouth every morning.     Multiple Vitamin (MULTIVITAMIN) capsule Take 1 capsule by mouth daily.      No current facility-administered medications for this visit.   Allergies: Allergies  Allergen Reactions   Other Hives   Singulair [Montelukast]     Mood changes   I reviewed her past medical history, social history, family history, and environmental history and no significant changes have been reported from her previous visit.  Review of Systems  Constitutional:  Negative for appetite change, chills,  fever and unexpected weight change.  HENT:  Negative for congestion and rhinorrhea.   Eyes:  Negative for itching.  Respiratory:  Negative for cough, chest tightness, shortness of breath and wheezing.   Cardiovascular:  Negative for chest pain.  Gastrointestinal:  Negative for abdominal pain.  Genitourinary:  Negative for difficulty urinating.  Skin:  Negative for rash.  Allergic/Immunologic: Positive for environmental allergies and food allergies.  Neurological:  Negative for headaches.    Objective: BP 106/70   Pulse 79   Temp 98.2 F (36.8 C)   Resp 18   Ht 5' 1.61" (1.565 m)   Wt 135 lb (61.2 kg)   SpO2 97%   BMI 25.00 kg/m  Body mass index is 25 kg/m. Physical Exam Vitals and nursing note reviewed.  Constitutional:      Appearance: Normal appearance. She is well-developed.  HENT:     Head: Normocephalic and atraumatic.     Right Ear: Tympanic membrane and external ear normal.     Left Ear: Tympanic membrane and external ear normal.     Nose: Nose normal. No rhinorrhea.     Mouth/Throat:     Mouth: Mucous membranes are moist.     Pharynx: Oropharynx is clear.  Eyes:     Conjunctiva/sclera: Conjunctivae normal.  Cardiovascular:     Rate and Rhythm: Normal  rate and regular rhythm.     Heart sounds: Normal heart sounds. No murmur heard.    No friction rub. No gallop.  Pulmonary:     Effort: Pulmonary effort is normal.     Breath sounds: Normal breath sounds. No wheezing, rhonchi or rales.  Musculoskeletal:     Cervical back: Neck supple.  Skin:    General: Skin is warm.     Findings: No rash.  Neurological:     Mental Status: She is alert and oriented to person, place, and time.  Psychiatric:        Behavior: Behavior normal.   Previous notes and tests were reviewed. The plan was reviewed with the patient/family, and all questions/concerned were addressed.  It was my pleasure to see Sarah Leblanc today and participate in her care. Please feel free to contact me  with any questions or concerns.  Sincerely,  Wyline Mood, DO Allergy & Immunology  [facility] office: [phone] [facility] office: [phone]

## 2023-04-23 NOTE — Patient Instructions (Addendum)
Environmental allergies 2021 skin testing positive to grass, weed, ragweed, trees, mold, dust mites, cat, cockroach.  Continue allergy injections. Next shot due on Dec 24th - come before noon. Will build up new vials faster (0.1, 0.3, 0.5) and go to every 4 weeks as maintenance dose.  Continue environmental control measures.  May use over the counter antihistamines such as Zyrtec (cetirizine), Claritin (loratadine), Allegra (fexofenadine), or Xyzal (levocetirizine) daily as needed. May take it twice a day if needed. Only take as needed now. Make sure you take on the days of injections.  Use Flonase (fluticasone) nasal spray 1-2 sprays per nostril once a day as needed for nasal congestion.  Nasal saline spray (i.e., Simply Saline) or nasal saline lavage (i.e., NeilMed) is recommended as needed and prior to medicated nasal sprays.  Oral allergy syndrome Continue to avoid items that still bother you. For mild symptoms you can take over the counter antihistamines such as Benadryl 1-2 tablets = 25-50mg  and monitor symptoms closely. If symptoms worsen or if you have severe symptoms including breathing issues, throat closure, significant swelling, whole body hives, severe diarrhea and vomiting, lightheadedness then inject epinephrine and seek immediate medical care afterwards. Emergency action plan in place.   Insect stings: If you notice any worsening symptoms. Let us know and we can order the bloodwork.  Follow up in 12 months or sooner if needed.

## 2023-04-24 ENCOUNTER — Telehealth: Payer: Self-pay | Admitting: *Deleted

## 2023-04-24 NOTE — Telephone Encounter (Signed)
-----   Message from Ellamae Sia sent at 04/23/2023  4:49 PM EST ----- Will build up new vials faster (0.1, 0.3, 0.5) and go to every 4 weeks as maintenance dose.

## 2023-04-24 NOTE — Telephone Encounter (Signed)
Patient's allergy flow sheet has been updated to reflect these changes.

## 2023-05-07 ENCOUNTER — Ambulatory Visit (INDEPENDENT_AMBULATORY_CARE_PROVIDER_SITE_OTHER): Payer: BC Managed Care – PPO

## 2023-05-07 DIAGNOSIS — J301 Allergic rhinitis due to pollen: Secondary | ICD-10-CM

## 2023-06-04 ENCOUNTER — Ambulatory Visit (INDEPENDENT_AMBULATORY_CARE_PROVIDER_SITE_OTHER): Payer: BC Managed Care – PPO

## 2023-06-04 DIAGNOSIS — J309 Allergic rhinitis, unspecified: Secondary | ICD-10-CM

## 2023-07-02 ENCOUNTER — Ambulatory Visit (INDEPENDENT_AMBULATORY_CARE_PROVIDER_SITE_OTHER): Payer: BC Managed Care – PPO

## 2023-07-02 DIAGNOSIS — J309 Allergic rhinitis, unspecified: Secondary | ICD-10-CM | POA: Diagnosis not present

## 2023-07-03 DIAGNOSIS — J3089 Other allergic rhinitis: Secondary | ICD-10-CM | POA: Diagnosis not present

## 2023-07-03 NOTE — Progress Notes (Signed)
VIAL ONE MADE 07-03-23. EXP 07-02-24.

## 2023-07-30 ENCOUNTER — Ambulatory Visit (INDEPENDENT_AMBULATORY_CARE_PROVIDER_SITE_OTHER)

## 2023-07-30 DIAGNOSIS — J309 Allergic rhinitis, unspecified: Secondary | ICD-10-CM | POA: Diagnosis not present

## 2023-08-27 ENCOUNTER — Ambulatory Visit

## 2023-08-27 DIAGNOSIS — J309 Allergic rhinitis, unspecified: Secondary | ICD-10-CM

## 2023-09-19 ENCOUNTER — Ambulatory Visit

## 2023-09-19 DIAGNOSIS — J309 Allergic rhinitis, unspecified: Secondary | ICD-10-CM | POA: Diagnosis not present

## 2023-10-01 ENCOUNTER — Ambulatory Visit (INDEPENDENT_AMBULATORY_CARE_PROVIDER_SITE_OTHER)

## 2023-10-01 DIAGNOSIS — J309 Allergic rhinitis, unspecified: Secondary | ICD-10-CM | POA: Diagnosis not present

## 2023-10-10 ENCOUNTER — Ambulatory Visit (INDEPENDENT_AMBULATORY_CARE_PROVIDER_SITE_OTHER)

## 2023-10-10 DIAGNOSIS — J309 Allergic rhinitis, unspecified: Secondary | ICD-10-CM

## 2023-11-07 ENCOUNTER — Ambulatory Visit (INDEPENDENT_AMBULATORY_CARE_PROVIDER_SITE_OTHER)

## 2023-11-07 DIAGNOSIS — J309 Allergic rhinitis, unspecified: Secondary | ICD-10-CM | POA: Diagnosis not present

## 2023-12-03 ENCOUNTER — Ambulatory Visit (INDEPENDENT_AMBULATORY_CARE_PROVIDER_SITE_OTHER)

## 2023-12-03 DIAGNOSIS — J309 Allergic rhinitis, unspecified: Secondary | ICD-10-CM | POA: Diagnosis not present

## 2023-12-19 DIAGNOSIS — E785 Hyperlipidemia, unspecified: Secondary | ICD-10-CM | POA: Diagnosis not present

## 2023-12-19 DIAGNOSIS — R5383 Other fatigue: Secondary | ICD-10-CM | POA: Diagnosis not present

## 2023-12-19 DIAGNOSIS — Z131 Encounter for screening for diabetes mellitus: Secondary | ICD-10-CM | POA: Diagnosis not present

## 2023-12-19 DIAGNOSIS — J309 Allergic rhinitis, unspecified: Secondary | ICD-10-CM | POA: Diagnosis not present

## 2023-12-19 DIAGNOSIS — E039 Hypothyroidism, unspecified: Secondary | ICD-10-CM | POA: Diagnosis not present

## 2023-12-19 DIAGNOSIS — E663 Overweight: Secondary | ICD-10-CM | POA: Diagnosis not present

## 2024-01-02 ENCOUNTER — Ambulatory Visit (INDEPENDENT_AMBULATORY_CARE_PROVIDER_SITE_OTHER)

## 2024-01-02 DIAGNOSIS — J309 Allergic rhinitis, unspecified: Secondary | ICD-10-CM | POA: Diagnosis not present

## 2024-01-10 DIAGNOSIS — Z1231 Encounter for screening mammogram for malignant neoplasm of breast: Secondary | ICD-10-CM | POA: Diagnosis not present

## 2024-01-30 ENCOUNTER — Ambulatory Visit (INDEPENDENT_AMBULATORY_CARE_PROVIDER_SITE_OTHER)

## 2024-01-30 DIAGNOSIS — J309 Allergic rhinitis, unspecified: Secondary | ICD-10-CM

## 2024-02-27 ENCOUNTER — Ambulatory Visit (INDEPENDENT_AMBULATORY_CARE_PROVIDER_SITE_OTHER)

## 2024-02-27 DIAGNOSIS — J309 Allergic rhinitis, unspecified: Secondary | ICD-10-CM

## 2024-03-06 DIAGNOSIS — J301 Allergic rhinitis due to pollen: Secondary | ICD-10-CM | POA: Diagnosis not present

## 2024-03-06 DIAGNOSIS — J3089 Other allergic rhinitis: Secondary | ICD-10-CM | POA: Diagnosis not present

## 2024-03-06 DIAGNOSIS — J3081 Allergic rhinitis due to animal (cat) (dog) hair and dander: Secondary | ICD-10-CM | POA: Diagnosis not present

## 2024-03-06 DIAGNOSIS — J302 Other seasonal allergic rhinitis: Secondary | ICD-10-CM | POA: Diagnosis not present

## 2024-03-06 NOTE — Progress Notes (Signed)
 VIALS MADE ON 03/06/24

## 2024-03-26 ENCOUNTER — Ambulatory Visit (INDEPENDENT_AMBULATORY_CARE_PROVIDER_SITE_OTHER)

## 2024-03-26 DIAGNOSIS — J309 Allergic rhinitis, unspecified: Secondary | ICD-10-CM

## 2024-04-21 ENCOUNTER — Ambulatory Visit

## 2024-04-21 DIAGNOSIS — J309 Allergic rhinitis, unspecified: Secondary | ICD-10-CM

## 2024-05-18 NOTE — Progress Notes (Signed)
 "  Follow Up Note  RE: Sarah Leblanc MRN: [id number] DOB: 10/22/1975 Date of Office Visit: 05/19/2024  Referring provider: Cleotilde Planas, MD Primary care provider: Cleotilde Planas, MD  Chief Complaint: Follow-up  History of Present Illness: I had the pleasure of seeing Sarah Leblanc for a follow up visit at the [facility] on 05/19/2024. She is a 49 y.o. female, who is being followed for allergic rhinoconjunctivitis on AIT, oral allergy syndrome, local reaction to hymenoptera sting. Her previous allergy office visit was on 04/23/2023 with Dr. Luke. Today is a regular follow up visit.  Discussed the use of AI scribe software for clinical note transcription with the patient, who gave verbal consent to proceed.    She has been receiving allergy shots since Sept 2021, with significant improvement in her symptoms. This fall has been the best she has experienced in a long time.  She only takes an allergy pill as needed, particularly when anticipating exposure to allergens such as birds and dogs at her mother-in-law's house. She experiences minimal side effects from the allergy shots, with only occasional itching at the injection site, which she describes as 'not too bad'.  She has not needed to use Flonase or other medications regularly and has been able to eat foods like almonds without experiencing any allergic reactions such as stomach issues or itchy mouth. She has successfully reintroduced fresh fruits and vegetables into her diet, including salads, carrots, and apples, without any problems.  She has not experienced any insect stings since her last visit and continues to carry an EpiPen  as a precaution. She has been on allergy shots for almost four years and is currently starting a new vial.     Assessment and Plan: Shalandra is a 49 y.o. female with: Seasonal allergic rhinitis due to pollen Allergic rhinitis due to animal dander Allergic rhinitis due to dust mite Allergic  rhinitis due to mold Allergy to cockroaches Allergic conjunctivitis of both eyes Past history - Rhinoconjunctivitis symptoms for the past 40+ years mainly from the spring through fall. 2021 skin testing positive to grass, weed, ragweed, trees, mold, dust mites, cat, cockroach. Started AIT on 01/14/2020 (G-W-RW-T & M-DM-C-CR). Interim history - well-controlled with allergy shots. Symptoms minimal and manageable.  Continue allergy injections. Stop after finished with current vial.  Continue environmental control measures.  May use over the counter antihistamines such as Zyrtec (cetirizine), Claritin (loratadine), Allegra (fexofenadine), or Xyzal (levocetirizine) daily as needed. May take it twice a day if needed. Only take as needed now. Make sure you take on the days of injections.  Use Flonase (fluticasone) nasal spray 1-2 sprays per nostril once a day as needed for nasal congestion.  Nasal saline spray (i.e., Simply Saline) or nasal saline lavage (i.e., NeilMed) is recommended as needed and prior to medicated nasal sprays.   Oral allergy syndrome, subsequent encounter Past history - Patient avoiding a variety of foods including broccoli, lettuce, leafy greens, certain fresh fruits, gums, tree nuts and carrageenan. Symptoms include GI issues to perioral pruritus. 2021 skin testing showed: Borderline positive to walnuts, almond, hazelnut, pistachio, cabbage. Positive to peanuts. Interim history - able to reintroduce fresh fruits/vegetables and tree nuts with no issues now.  Continue to avoid items that still bother you. For mild symptoms you can take over the counter antihistamines (zyrtec 10mg  to 20mg ) and monitor symptoms closely.  If symptoms worsen or if you have severe symptoms including breathing issues, throat closure, significant swelling, whole body hives, severe  diarrhea and vomiting, lightheadedness then use epinephrine  and seek immediate medical care afterwards. Emergency action plan in  place.    Local reaction to hymenoptera sting Past history - Large localized reaction to fire ant bites. 2021 skin testing was negative to fire ant.  If having systemic symptoms in the future then will order fire ant IgE at that time.   Return in about 1 year (around 05/19/2025).  Meds ordered this encounter  Medications   EPINEPHrine  (AUVI-Q ) 0.3 mg/0.3 mL IJ SOAJ injection    Sig: Inject 0.3 mg into the muscle as needed for anaphylaxis.    Dispense:  2 each    Refill:  1    [phone]   Lab Orders  No laboratory test(s) ordered today    Diagnostics: None.   Medication List:  Current Outpatient Medications  Medication Sig Dispense Refill   calcium-vitamin D (OSCAL WITH D) 500-200 MG-UNIT tablet Take 1 tablet by mouth.      cetirizine (ZYRTEC) 10 MG tablet Take 10 mg by mouth daily.      famotidine (PEPCID) 10 MG tablet Take 20 mg by mouth daily.     levothyroxine (SYNTHROID) 88 MCG tablet Take 88 mcg by mouth every morning.     Multiple Vitamin (MULTIVITAMIN) capsule Take 1 capsule by mouth daily.      EPINEPHrine  (AUVI-Q ) 0.3 mg/0.3 mL IJ SOAJ injection Inject 0.3 mg into the muscle as needed for anaphylaxis. 2 each 1   No current facility-administered medications for this visit.   Allergies: Allergies[1] I reviewed her past medical history, social history, family history, and environmental history and no significant changes have been reported from her previous visit.  Review of Systems  Constitutional:  Negative for appetite change, chills, fever and unexpected weight change.  HENT:  Negative for congestion and rhinorrhea.   Eyes:  Negative for itching.  Respiratory:  Negative for cough, chest tightness, shortness of breath and wheezing.   Cardiovascular:  Negative for chest pain.  Gastrointestinal:  Negative for abdominal pain.  Genitourinary:  Negative for difficulty urinating.  Skin:  Negative for rash.  Allergic/Immunologic: Positive for environmental allergies  and food allergies.  Neurological:  Negative for headaches.    Objective: BP 120/74   Pulse (!) 102   Temp 97.7 F (36.5 C)   Ht 5' 2 (1.575 m)   Wt 138 lb (62.6 kg)   SpO2 97%   BMI 25.24 kg/m  Body mass index is 25.24 kg/m. Physical Exam Vitals and nursing note reviewed.  Constitutional:      Appearance: Normal appearance. She is well-developed.  HENT:     Head: Normocephalic and atraumatic.     Right Ear: Tympanic membrane and external ear normal.     Left Ear: Tympanic membrane and external ear normal.     Nose: Nose normal. No rhinorrhea.     Mouth/Throat:     Mouth: Mucous membranes are moist.     Pharynx: Oropharynx is clear.  Eyes:     Conjunctiva/sclera: Conjunctivae normal.  Cardiovascular:     Rate and Rhythm: Normal rate and regular rhythm.     Heart sounds: Normal heart sounds. No murmur heard.    No friction rub. No gallop.  Pulmonary:     Effort: Pulmonary effort is normal.     Breath sounds: Normal breath sounds. No wheezing, rhonchi or rales.  Musculoskeletal:     Cervical back: Neck supple.  Skin:    General: Skin is warm.  Findings: No rash.  Neurological:     Mental Status: She is alert and oriented to person, place, and time.  Psychiatric:        Behavior: Behavior normal.    Previous notes and tests were reviewed. The plan was reviewed with the patient/family, and all questions/concerned were addressed.  It was my pleasure to see Nalleli today and participate in her care. Please feel free to contact me with any questions or concerns.  Sincerely,  Orlan Cramp, DO Allergy & Immunology  [facility] [place]  [facility] office: [phone] [facility] office: [phone]    [1]  Allergies Allergen Reactions   Other Hives   Singulair  [Montelukast ]     Mood changes   "

## 2024-05-19 ENCOUNTER — Encounter: Payer: Self-pay | Admitting: Allergy

## 2024-05-19 ENCOUNTER — Ambulatory Visit (INDEPENDENT_AMBULATORY_CARE_PROVIDER_SITE_OTHER): Admitting: Allergy

## 2024-05-19 VITALS — BP 120/74 | HR 102 | Temp 97.7°F | Ht 62.0 in | Wt 138.0 lb

## 2024-05-19 DIAGNOSIS — T63481D Toxic effect of venom of other arthropod, accidental (unintentional), subsequent encounter: Secondary | ICD-10-CM | POA: Diagnosis not present

## 2024-05-19 DIAGNOSIS — T7819XD Other adverse food reactions, not elsewhere classified, subsequent encounter: Secondary | ICD-10-CM

## 2024-05-19 DIAGNOSIS — J3089 Other allergic rhinitis: Secondary | ICD-10-CM

## 2024-05-19 DIAGNOSIS — J3081 Allergic rhinitis due to animal (cat) (dog) hair and dander: Secondary | ICD-10-CM

## 2024-05-19 DIAGNOSIS — J301 Allergic rhinitis due to pollen: Secondary | ICD-10-CM

## 2024-05-19 DIAGNOSIS — Z91038 Other insect allergy status: Secondary | ICD-10-CM

## 2024-05-19 DIAGNOSIS — H1013 Acute atopic conjunctivitis, bilateral: Secondary | ICD-10-CM | POA: Diagnosis not present

## 2024-05-19 DIAGNOSIS — J302 Other seasonal allergic rhinitis: Secondary | ICD-10-CM

## 2024-05-19 DIAGNOSIS — T63481A Toxic effect of venom of other arthropod, accidental (unintentional), initial encounter: Secondary | ICD-10-CM

## 2024-05-19 MED ORDER — EPINEPHRINE 0.3 MG/0.3ML IJ SOAJ: 0.3000 mg | INTRAMUSCULAR | 1 refills | Status: AC | PRN

## 2024-05-19 NOTE — Patient Instructions (Addendum)
 Environmental allergies 2021 skin testing positive to grass, weed, ragweed, trees, mold, dust mites, cat, cockroach.  Continue allergy injections. Stop after finished with current vial.  Continue environmental control measures.  May use over the counter antihistamines such as Zyrtec (cetirizine), Claritin (loratadine), Allegra (fexofenadine), or Xyzal (levocetirizine) daily as needed. May take it twice a day if needed. Only take as needed now. Make sure you take on the days of injections.  Use Flonase (fluticasone) nasal spray 1-2 sprays per nostril once a day as needed for nasal congestion.  Nasal saline spray (i.e., Simply Saline) or nasal saline lavage (i.e., NeilMed) is recommended as needed and prior to medicated nasal sprays.  Oral allergy syndrome Continue to avoid items that still bother you. For mild symptoms you can take over the counter antihistamines (zyrtec 10mg  to 20mg ) and monitor symptoms closely.  If symptoms worsen or if you have severe symptoms including breathing issues, throat closure, significant swelling, whole body hives, severe diarrhea and vomiting, lightheadedness then use epinephrine  and seek immediate medical care afterwards. Emergency action plan in place.   Insect stings If you notice any worsening symptoms. Let us  know and we can order the bloodwork.  Follow up in 12 months or sooner if needed.

## 2024-05-28 ENCOUNTER — Ambulatory Visit

## 2024-05-28 DIAGNOSIS — J301 Allergic rhinitis due to pollen: Secondary | ICD-10-CM

## 2024-06-04 ENCOUNTER — Ambulatory Visit

## 2024-06-04 DIAGNOSIS — J301 Allergic rhinitis due to pollen: Secondary | ICD-10-CM
